# Patient Record
Sex: Male | Born: 1990
Health system: Southern US, Community
[De-identification: ages and names within clinical notes are randomized; demographics above are authoritative.]

## PROBLEM LIST (undated history)

## (undated) DIAGNOSIS — F329 Major depressive disorder, single episode, unspecified: Secondary | ICD-10-CM

## (undated) DIAGNOSIS — F4310 Post-traumatic stress disorder, unspecified: Secondary | ICD-10-CM

## (undated) DIAGNOSIS — F419 Anxiety disorder, unspecified: Secondary | ICD-10-CM

## (undated) DIAGNOSIS — F32A Depression, unspecified: Secondary | ICD-10-CM

## (undated) DIAGNOSIS — I10 Essential (primary) hypertension: Secondary | ICD-10-CM

## (undated) DIAGNOSIS — G43909 Migraine, unspecified, not intractable, without status migrainosus: Secondary | ICD-10-CM

## (undated) HISTORY — DX: Migraine, unspecified, not intractable, without status migrainosus: G43.909

## (undated) HISTORY — PX: OTHER SURGICAL HISTORY: SHX169

---
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
## 2018-08-10 ENCOUNTER — Emergency Department (HOSPITAL_COMMUNITY)
Admission: EM | Admit: 2018-08-10 | Discharge: 2018-08-11 | Disposition: A | Discharge status: Home / Self Care | Payer: Self-pay | Attending: Emergency Medicine | Admitting: Emergency Medicine

## 2018-08-10 ENCOUNTER — Emergency Department (HOSPITAL_COMMUNITY): Payer: Self-pay

## 2018-08-10 ENCOUNTER — Encounter (HOSPITAL_COMMUNITY): Payer: Self-pay | Admitting: Emergency Medicine

## 2018-08-10 ENCOUNTER — Other Ambulatory Visit: Payer: Self-pay

## 2018-08-10 DIAGNOSIS — M546 Pain in thoracic spine: Secondary | ICD-10-CM | POA: Insufficient documentation

## 2018-08-10 DIAGNOSIS — R809 Proteinuria, unspecified: Secondary | ICD-10-CM | POA: Insufficient documentation

## 2018-08-10 NOTE — ED Provider Notes (Signed)
[facility] EMERGENCY DEPARTMENT Provider Note   CSN: [id number] Arrival date & time: 08/10/18  2134     History   Chief Complaint Chief Complaint  Patient presents with  . Back Pain    HPI Marc Sandoval is a 27 y.o. male with no major medical problems presents to the Emergency Department complaining of intermittent worsening central and upper back pain onset 3 weeks ago while "couch surfing" with a friend.  Pain is dull, tight and occasionally sharp and rates it at a 10/10.  He reports the pain worsened 3 days ago.  Pt denies known trauma or falls.  Pt denies heavy lifting or new activities.  Denies fever, chills, headaches, vision changes, neck pain, numbness, tingling, weakness, loss of bowel or bladder control, gait disturbance.  Pt reports taking advil this morning without relief.  Pt denies IVDU, anticoagulation, hx of cancer, known trauma, fevers, weight loss, night sweats.  Pt reports a recent diagnosis of HTN, but is not taking any medication, though it was prescribed by his PCP.  Pt is a current smoker.  The history is provided by the patient and medical records. No language interpreter was used.    History reviewed. No pertinent past medical history.  There are no active problems to display for this patient.   History reviewed. No pertinent surgical history.      Home Medications    Prior to Admission medications   Medication Sig Start Date End Date Taking? Authorizing Provider  meloxicam (MOBIC) 15 MG tablet Take 1 tablet (15 mg total) by mouth daily. 08/11/18   Faydra Korman, Dahlia Client, PA-C  methocarbamol (ROBAXIN) 500 MG tablet Take 1 tablet (500 mg total) by mouth 2 (two) times daily. 08/11/18   Hibo Blasdell, Dahlia Client, PA-C    Family History No family history on file.  Social History Social History   Tobacco Use  . Smoking status: Never Smoker  . Smokeless tobacco: Never Used  Substance Use Topics  . Alcohol use: Yes  . Drug use: Not  Currently     Allergies   Patient has no known allergies.   Review of Systems Review of Systems  Constitutional: Negative for fatigue and fever.  Respiratory: Negative for chest tightness and shortness of breath.   Cardiovascular: Negative for chest pain.  Gastrointestinal: Negative for abdominal pain, diarrhea, nausea and vomiting.  Genitourinary: Negative for dysuria, frequency, hematuria and urgency.  Musculoskeletal: Positive for back pain. Negative for gait problem, joint swelling, neck pain and neck stiffness.  Skin: Negative for rash.  Neurological: Negative for weakness, light-headedness, numbness and headaches.  All other systems reviewed and are negative.    Physical Exam Updated Vital Signs BP (!) 152/93 (BP Location: Right Arm)   Pulse 80   Temp 97.7 F (36.5 C) (Oral)   Resp 18   SpO2 100%   Physical Exam  Constitutional: He appears well-developed and well-nourished. No distress.  HENT:  Head: Normocephalic and atraumatic.  Mouth/Throat: Oropharynx is clear and moist. No oropharyngeal exudate.  Eyes: Conjunctivae are normal.  Neck: Normal range of motion. Neck supple.  Full ROM without pain No midline or paraspinal tenderness  Cardiovascular: Normal rate, regular rhythm and intact distal pulses.  Pulmonary/Chest: Effort normal and breath sounds normal. No respiratory distress. He has no wheezes.  Abdominal: Soft. He exhibits no distension. There is no tenderness.  Musculoskeletal:  Full range of motion of the T-spine and L-spine No midline tenderness to the  T-spine or L-spine Tenderness to palpation of  the paraspinous muscles of the T-spine  Lymphadenopathy:    He has no cervical adenopathy.  Neurological: He is alert.  Speech is clear and goal oriented, follows commands Normal 5/5 strength in upper and lower extremities bilaterally including dorsiflexion and plantar flexion, strong and equal grip strength Sensation normal to light and sharp  touch Moves extremities without ataxia, coordination intact Normal gait Normal balance No Clonus  Skin: Skin is warm and dry. No rash noted. He is not diaphoretic. No erythema.  Psychiatric: He has a normal mood and affect. His behavior is normal.  Nursing note and vitals reviewed.    ED Treatments / Results  Labs (all labs ordered are listed, but only abnormal results are displayed) Labs Reviewed  URINALYSIS, ROUTINE W REFLEX MICROSCOPIC - Abnormal; Notable for the following components:      Result Value   Protein, ur 30 (*)    All other components within normal limits    Radiology Dg Thoracic Spine 2 View  Result Date: 08/10/2018 CLINICAL DATA:  Thoracic spine pain. EXAM: THORACIC SPINE 2 VIEWS COMPARISON:  None. FINDINGS: There is no evidence of thoracic spine fracture. Alignment is normal. No other significant bone abnormalities are identified. IMPRESSION: Negative. Electronically Signed   By: Kennith CenterEric  Mansell M.D.   On: 08/10/2018 23:25    Procedures Procedures (including critical care time)  Medications Ordered in ED Medications  naproxen (NAPROSYN) tablet 500 mg (has no administration in time range)     Initial Impression / Assessment and Plan / ED Course  I have reviewed the triage vital signs and the nursing notes.  Pertinent labs & imaging results that were available during my care of the patient were reviewed by me and considered in my medical decision making (see chart for details).  Clinical Course as of Aug 11 28  Sat Aug 10, 2018  2231 HTN noted  BP(!): 152/93 [HM]    Clinical Course User Index [HM] Josemaria Brining, Dahlia ClientHannah, [place]PA-C    Patient with back pain.  No neurological deficits and normal neuro exam.  Patient can walk without difficulty.  No loss of bowel or bladder control.  No concern for cauda equina.  No fever, night sweats, weight loss, h/o cancer, IVDU.    Plain films of the thoracic spine are without acute abnormality.  I personally evaluated  these images.  Urinalysis without evidence of urinary tract infection.  Some protein is noted.  Additionally, patient is hypertensive.  He has a history of same.  These findings were discussed with the patient and he has been given a resource guide to find a primary care provider.  Patient states he will do this.  Discussed reasons to return immediately to the emergency department.  Patient states understanding and is in agreement with the plan.  Final Clinical Impressions(s) / ED Diagnoses   Final diagnoses:  Acute bilateral thoracic back pain  Proteinuria, unspecified type    ED Discharge Orders         Ordered    meloxicam (MOBIC) 15 MG tablet  Daily     08/11/18 0018    methocarbamol (ROBAXIN) 500 MG tablet  2 times daily     08/11/18 0018           Meredyth Hornung, Boyd KerbsHannah, PA-C 08/11/18 0029    Eber HongMiller, Brian, MD 08/11/18 [phone]

## 2018-08-10 NOTE — ED Triage Notes (Signed)
Pt c/o central back pain x 3 weeks, increasing in last 3 days. Denies injury/trauma. Ambulatory without difficulty.

## 2018-08-10 NOTE — ED Notes (Signed)
Pt in the restroom attempting to provide a urine specimen.

## 2018-08-10 NOTE — ED Notes (Signed)
Patient transported to X-ray 

## 2018-08-11 ENCOUNTER — Emergency Department (HOSPITAL_COMMUNITY): Payer: Self-pay

## 2018-08-11 ENCOUNTER — Emergency Department (HOSPITAL_COMMUNITY)
Admission: EM | Admit: 2018-08-11 | Discharge: 2018-08-11 | Disposition: A | Discharge status: Home / Self Care | Payer: Self-pay | Attending: Emergency Medicine | Admitting: Emergency Medicine

## 2018-08-11 ENCOUNTER — Other Ambulatory Visit: Payer: Self-pay

## 2018-08-11 DIAGNOSIS — M546 Pain in thoracic spine: Secondary | ICD-10-CM | POA: Insufficient documentation

## 2018-08-11 DIAGNOSIS — R0602 Shortness of breath: Secondary | ICD-10-CM | POA: Insufficient documentation

## 2018-08-11 DIAGNOSIS — I10 Essential (primary) hypertension: Secondary | ICD-10-CM | POA: Insufficient documentation

## 2018-08-11 LAB — D-DIMER, QUANTITATIVE: D-Dimer, Quant: <0.27 ug/mL-FEU (ref 0.00–0.50)

## 2018-08-11 LAB — URINALYSIS, ROUTINE W REFLEX MICROSCOPIC
Bacteria, UA: NONE SEEN
Bilirubin Urine: NEGATIVE
Glucose, UA: NEGATIVE mg/dL
Hgb urine dipstick: NEGATIVE
Ketones, ur: NEGATIVE mg/dL
Leukocytes, UA: NEGATIVE
Nitrite: NEGATIVE
PH: 6 (ref 5.0–8.0)
Protein, ur: 30 mg/dL — AB
Specific Gravity, Urine: 1.026 (ref 1.005–1.030)

## 2018-08-11 LAB — CBC WITH DIFFERENTIAL/PLATELET
Abs Immature Granulocytes: 0.02 10*3/uL (ref 0.00–0.07)
Basophils Absolute: 0 10*3/uL (ref 0.0–0.1)
Basophils Relative: 0 %
EOS ABS: 0.2 10*3/uL (ref 0.0–0.5)
Eosinophils Relative: 2 %
HCT: 44 % (ref 39.0–52.0)
Hemoglobin: 14 g/dL (ref 13.0–17.0)
Immature Granulocytes: 0 %
LYMPHS ABS: 2.4 10*3/uL (ref 0.7–4.0)
Lymphocytes Relative: 30 %
MCH: 27.3 pg (ref 26.0–34.0)
MCHC: 31.8 g/dL (ref 30.0–36.0)
MCV: 85.8 fL (ref 80.0–100.0)
Monocytes Absolute: 0.8 10*3/uL (ref 0.1–1.0)
Monocytes Relative: 10 %
Neutro Abs: 4.4 10*3/uL (ref 1.7–7.7)
Neutrophils Relative %: 58 %
Platelets: 250 10*3/uL (ref 150–400)
RBC: 5.13 MIL/uL (ref 4.22–5.81)
RDW: 12.9 % (ref 11.5–15.5)
WBC: 7.8 10*3/uL (ref 4.0–10.5)
nRBC: 0 % (ref 0.0–0.2)

## 2018-08-11 LAB — BASIC METABOLIC PANEL
Anion gap: 5 (ref 5–15)
BUN: 11 mg/dL (ref 6–20)
CO2: 24 mmol/L (ref 22–32)
Calcium: 8.7 mg/dL — ABNORMAL LOW (ref 8.9–10.3)
Chloride: 105 mmol/L (ref 98–111)
Creatinine, Ser: 0.93 mg/dL (ref 0.61–1.24)
GFR calc Af Amer: >60 mL/min (ref >60)
GFR calc non Af Amer: >60 mL/min (ref >60)
Glucose, Bld: 94 mg/dL (ref 70–99)
Potassium: 3.8 mmol/L (ref 3.5–5.1)
Sodium: 134 mmol/L — ABNORMAL LOW (ref 135–145)

## 2018-08-11 MED ORDER — MELOXICAM 15 MG PO TABS: 15.0000 mg | ORAL_TABLET | Freq: Every day | ORAL | 0 refills | Status: DC

## 2018-08-11 MED ORDER — NAPROXEN 250 MG PO TABS
500.0000 mg | ORAL_TABLET | Freq: Once | ORAL | Status: AC
  Administered 2018-08-11: 500 mg via ORAL
  Filled 2018-08-11: qty 2

## 2018-08-11 MED ORDER — METHOCARBAMOL 500 MG PO TABS: 500.0000 mg | ORAL_TABLET | Freq: Two times a day (BID) | ORAL | 0 refills | Status: DC

## 2018-08-11 MED ORDER — KETOROLAC TROMETHAMINE 30 MG/ML IJ SOLN
30.0000 mg | Freq: Once | INTRAMUSCULAR | Status: AC
  Administered 2018-08-11: 30 mg via INTRAVENOUS
  Filled 2018-08-11: qty 1

## 2018-08-11 MED ORDER — KETOROLAC TROMETHAMINE 60 MG/2ML IM SOLN
30.0000 mg | Freq: Once | INTRAMUSCULAR | Status: DC
  Filled 2018-08-11: qty 2

## 2018-08-11 MED ORDER — LIDOCAINE 5 % EX PTCH
1.0000 | MEDICATED_PATCH | CUTANEOUS | Status: DC
  Administered 2018-08-11: 1 via TRANSDERMAL
  Filled 2018-08-11: qty 1

## 2018-08-11 NOTE — ED Notes (Signed)
Patient verbalizes understanding of discharge instructions. Opportunity for questioning and answers were provided. Armband removed by staff, pt discharged from ED ambulatory.   

## 2018-08-11 NOTE — ED Triage Notes (Signed)
Pt brought in by [facility] from home for midthoracic back pain, pt seen last night for same, prescribed a muscle relaxer but has not taken because he believes the pain is related to his "organs" not his muscles. Pt ambulatory, in NAD on arrival.

## 2018-08-11 NOTE — ED Notes (Signed)
Pt. Back from X-Ray

## 2018-08-11 NOTE — Discharge Instructions (Signed)
Please start Mobic and take Robaxin as needed for muscle pain Follow up with a primary doctor

## 2018-08-11 NOTE — ED Notes (Signed)
Pt. Transported to Xray 

## 2018-08-11 NOTE — ED Provider Notes (Signed)
[facility] Provider Note   CSN: [id number] Arrival date & time: 08/11/18  1355    History   Chief Complaint Chief Complaint  Patient presents with  . Back Pain   HPI Hughie Closssaiah Malecha is a 27 y.o. male who presents with thoracic back pain.  Past medical history significant for hypertension.  Patient states that for the past 3 weeks to 1 month has had intermittent thoracic back pain radiating to the left side of his back.  The past 3 days the pain has intensified.  He came to the ED last night was told that he likely musculoskeletal.  He had an x-ray of his thoracic spine and a urine test which were both essentially normal.  He was advised to follow-up with primary care.  This morning the pain became severe and constant in nature. He reports associated SOB and pain with inspiration which is new. He has not taken the meloxicam or Robaxin that was prescribed to him.  He is very concerned that the pain is not musculoskeletal although he does state his sleeping arrangement is not ideal and uncomfortable. He feels like it is a deeper pain and it radiates to his left ribs.  He's never had anything like this before. He denies fever, chest pain, cough, difficulty urinating. No recent surgery/immobilization, hx of cancer, leg swelling, hemoptysis, prior DVT/PE, or hormone use. [place] 11 [place] train ride [place] a [place] weeks ago. He is a smoker.  HPI  No past medical history on file.  There are no active problems to display for this patient.   No past surgical history on file.      Home Medications    Prior to Admission medications   Medication Sig Start Date End Date Taking? Authorizing Provider  meloxicam (MOBIC) 15 MG tablet Take 1 tablet (15 mg total) by mouth daily. 08/11/18   Muthersbaugh, Dahlia ClientHannah, PA-C  methocarbamol (ROBAXIN) 500 MG tablet Take 1 tablet (500 mg total) by mouth 2 (two) times daily. 08/11/18   Muthersbaugh, Dahlia ClientHannah, PA-C    Family  History No family history on file.  Social History Social History   Tobacco Use  . Smoking status: Never Smoker  . Smokeless tobacco: Never Used  Substance Use Topics  . Alcohol use: Yes  . Drug use: Not Currently     Allergies   Patient has no known allergies.   Review of Systems Review of Systems  Constitutional: Negative for fever.  Respiratory: Positive for shortness of breath. Negative for cough.   Cardiovascular: Negative for chest pain.  Gastrointestinal: Negative for abdominal pain.  Musculoskeletal: Positive for back pain. Negative for neck pain.  Neurological: Negative for weakness.  All other systems reviewed and are negative.    Physical Exam Updated Vital Signs BP (!) 148/108 (BP Location: Right Arm)   Pulse (!) 50   Temp 97.9 F (36.6 C) (Oral)   Resp 16   SpO2 100%   Physical Exam  Constitutional: He is oriented to person, place, and time. He appears well-developed and well-nourished. No distress.  Calm and cooperative  HENT:  Head: Normocephalic and atraumatic.  Eyes: Pupils are equal, round, and reactive to light. Conjunctivae are normal. Right eye exhibits no discharge. Left eye exhibits no discharge. No scleral icterus.  Neck: Normal range of motion.  Cardiovascular: Normal rate and regular rhythm.  Pulmonary/Chest: Effort normal and breath sounds normal. No respiratory distress. He exhibits tenderness (left sided lower rib tenderness).  Abdominal:  He exhibits no distension.  Musculoskeletal:  Mid-thoracic spine tenderness with left paraspinal muscle tenderness  Neurological: He is alert and oriented to person, place, and time.  Skin: Skin is warm and dry.  Psychiatric: He has a normal mood and affect. His behavior is normal.  Nursing note and vitals reviewed.    ED Treatments / Results  Labs (all labs ordered are listed, but only abnormal results are displayed) Labs Reviewed  BASIC METABOLIC PANEL - Abnormal; Notable for the following  components:      Result Value   Sodium 134 (*)    Calcium 8.7 (*)    All other components within normal limits  CBC WITH DIFFERENTIAL/PLATELET  D-DIMER, QUANTITATIVE (NOT AT [facility])    EKG EKG Interpretation  Date/Time:  Sunday August 11 2018 14:20:33 EST Ventricular Rate:  65 PR Interval:  128 QRS Duration: 102 QT Interval:  420 QTC Calculation: 436 R Axis:   81 Text Interpretation:  Normal sinus rhythm with sinus arrhythmia Minimal voltage criteria for LVH, may be normal variant Borderline ECG No old tracing to compare Confirmed by Linwood Dibbles [phone]) on 08/11/2018 2:23:11 PM   Radiology Dg Thoracic Spine 2 View  Result Date: 08/10/2018 CLINICAL DATA:  Thoracic spine pain. EXAM: THORACIC SPINE 2 VIEWS COMPARISON:  None. FINDINGS: There is no evidence of thoracic spine fracture. Alignment is normal. No other significant bone abnormalities are identified. IMPRESSION: Negative. Electronically Signed   By: Kennith Center M.D.   On: 08/10/2018 23:25    Procedures Procedures (including critical care time)  Medications Ordered in ED Medications  lidocaine (LIDODERM) 5 % 1 patch (1 patch Transdermal Patch Applied 08/11/18 1428)  ketorolac (TORADOL) 30 MG/ML injection 30 mg (30 mg Intravenous Given 08/11/18 1436)     Initial Impression / Assessment and Plan / ED Course  I have reviewed the triage vital signs and the nursing notes.  Pertinent labs & imaging results that were available during my care of the patient were reviewed by me and considered in my medical decision making (see chart for details).  27 year old male presents with ongoing thoracic back pain which radiates to his ribs.  He is mildly hypertensive but otherwise vital signs are normal.  His pulse is 50 which is likely normal for him since he is young and good shape.  Results were reviewed from yesterday.  He had no signs of infection in his UA.  He did have 30 protein in the urine which is likely due to his hypertension.   His thoracic chest x-ray was normal.  He is very concerned that his pain is not musculoskeletal although is easily reproducible on exam.  Will obtain basic labs, d-dimer, chest x-ray and provide pain control.  CBC and BMP are normal.  D-dimer is normal.  EKG is sinus rhythm with sinus arrhythmia. CXR is negative. Discussed with pt that symptoms are likely MSK. He was advised to f/u with a PCP.  Final Clinical Impressions(s) / ED Diagnoses   Final diagnoses:  Acute left-sided thoracic back pain    ED Discharge Orders    None       Bethel Born, PA-C 08/12/18 1742    Linwood Dibbles, MD 08/13/18 1105

## 2018-08-11 NOTE — Discharge Instructions (Addendum)
1. Medications: meloxicam, methocarbamol, usual home medications 2. Treatment: rest, drink plenty of fluids, gentle stretching as discussed, alternate ice and heat 3. Follow Up: Please followup with your primary doctor in 3 days for discussion of your diagnoses and further evaluation after today's visit; if you do not have a primary care doctor use the resource guide provided to find one;  Return to the ER for worsening back pain, difficulty walking, loss of bowel or bladder control or other concerning symptoms

## 2018-09-16 ENCOUNTER — Inpatient Hospital Stay: Payer: Medicaid Other | Admitting: Critical Care Medicine

## 2018-09-16 ENCOUNTER — Encounter: Payer: Self-pay | Admitting: Family Medicine

## 2018-09-17 ENCOUNTER — Ambulatory Visit: Payer: Medicaid Other | Admitting: Family Medicine

## 2018-10-07 ENCOUNTER — Encounter (HOSPITAL_COMMUNITY): Payer: Self-pay

## 2018-10-07 ENCOUNTER — Other Ambulatory Visit: Payer: Self-pay

## 2018-10-07 ENCOUNTER — Emergency Department (HOSPITAL_COMMUNITY): Payer: No Typology Code available for payment source

## 2018-10-07 ENCOUNTER — Emergency Department (HOSPITAL_COMMUNITY)
Admission: EM | Admit: 2018-10-07 | Discharge: 2018-10-07 | Disposition: A | Discharge status: Home / Self Care | Payer: No Typology Code available for payment source | Attending: Emergency Medicine | Admitting: Emergency Medicine

## 2018-10-07 DIAGNOSIS — Z79899 Other long term (current) drug therapy: Secondary | ICD-10-CM | POA: Insufficient documentation

## 2018-10-07 DIAGNOSIS — S92152A Displaced avulsion fracture (chip fracture) of left talus, initial encounter for closed fracture: Secondary | ICD-10-CM | POA: Diagnosis not present

## 2018-10-07 DIAGNOSIS — Z23 Encounter for immunization: Secondary | ICD-10-CM | POA: Diagnosis not present

## 2018-10-07 DIAGNOSIS — Y999 Unspecified external cause status: Secondary | ICD-10-CM | POA: Diagnosis not present

## 2018-10-07 DIAGNOSIS — M542 Cervicalgia: Secondary | ICD-10-CM | POA: Diagnosis not present

## 2018-10-07 DIAGNOSIS — Y9389 Activity, other specified: Secondary | ICD-10-CM | POA: Diagnosis not present

## 2018-10-07 DIAGNOSIS — S1093XA Contusion of unspecified part of neck, initial encounter: Secondary | ICD-10-CM

## 2018-10-07 DIAGNOSIS — T07XXXA Unspecified multiple injuries, initial encounter: Secondary | ICD-10-CM

## 2018-10-07 DIAGNOSIS — R531 Weakness: Secondary | ICD-10-CM | POA: Insufficient documentation

## 2018-10-07 DIAGNOSIS — M25562 Pain in left knee: Secondary | ICD-10-CM | POA: Diagnosis not present

## 2018-10-07 DIAGNOSIS — I10 Essential (primary) hypertension: Secondary | ICD-10-CM | POA: Insufficient documentation

## 2018-10-07 DIAGNOSIS — S82892A Other fracture of left lower leg, initial encounter for closed fracture: Secondary | ICD-10-CM

## 2018-10-07 DIAGNOSIS — T1490XA Injury, unspecified, initial encounter: Secondary | ICD-10-CM

## 2018-10-07 DIAGNOSIS — M25521 Pain in right elbow: Secondary | ICD-10-CM | POA: Diagnosis not present

## 2018-10-07 DIAGNOSIS — Y929 Unspecified place or not applicable: Secondary | ICD-10-CM | POA: Diagnosis not present

## 2018-10-07 DIAGNOSIS — S99912A Unspecified injury of left ankle, initial encounter: Secondary | ICD-10-CM | POA: Diagnosis present

## 2018-10-07 HISTORY — DX: Essential (primary) hypertension: I10

## 2018-10-07 LAB — CBC
HCT: 44.3 % (ref 39.0–52.0)
HEMOGLOBIN: 14 g/dL (ref 13.0–17.0)
MCH: 27.4 pg (ref 26.0–34.0)
MCHC: 31.6 g/dL (ref 30.0–36.0)
MCV: 86.7 fL (ref 80.0–100.0)
Platelets: 254 10*3/uL (ref 150–400)
RBC: 5.11 MIL/uL (ref 4.22–5.81)
RDW: 13.2 % (ref 11.5–15.5)
WBC: 7.4 10*3/uL (ref 4.0–10.5)
nRBC: 0 % (ref 0.0–0.2)

## 2018-10-07 LAB — PROTIME-INR
INR: 1.18
Prothrombin Time: 14.9 seconds (ref 11.4–15.2)

## 2018-10-07 LAB — LACTIC ACID, PLASMA
Lactic Acid, Venous: 5.6 mmol/L (ref 0.5–1.9)
Lactic Acid, Venous: 1.1 mmol/L (ref 0.5–1.9)

## 2018-10-07 LAB — COMPREHENSIVE METABOLIC PANEL
ALBUMIN: 4.4 g/dL (ref 3.5–5.0)
ALT: 13 U/L (ref 0–44)
ANION GAP: 16 — AB (ref 5–15)
AST: 34 U/L (ref 15–41)
Alkaline Phosphatase: 49 U/L (ref 38–126)
BUN: 17 mg/dL (ref 6–20)
CO2: 17 mmol/L — ABNORMAL LOW (ref 22–32)
Calcium: 9.4 mg/dL (ref 8.9–10.3)
Chloride: 106 mmol/L (ref 98–111)
Creatinine, Ser: 1.42 mg/dL — ABNORMAL HIGH (ref 0.61–1.24)
GFR calc Af Amer: >60 mL/min (ref >60)
GFR calc non Af Amer: >60 mL/min (ref >60)
Glucose, Bld: 158 mg/dL — ABNORMAL HIGH (ref 70–99)
Potassium: 3.5 mmol/L (ref 3.5–5.1)
Sodium: 139 mmol/L (ref 135–145)
Total Bilirubin: 0.9 mg/dL (ref 0.3–1.2)
Total Protein: 7.6 g/dL (ref 6.5–8.1)

## 2018-10-07 LAB — URINALYSIS, ROUTINE W REFLEX MICROSCOPIC
Bacteria, UA: NONE SEEN
Bilirubin Urine: NEGATIVE
Glucose, UA: NEGATIVE mg/dL
Hgb urine dipstick: NEGATIVE
KETONES UR: NEGATIVE mg/dL
Leukocytes, UA: NEGATIVE
Nitrite: NEGATIVE
Protein, ur: 30 mg/dL — AB
Specific Gravity, Urine: 1.035 — ABNORMAL HIGH (ref 1.005–1.030)
pH: 8 (ref 5.0–8.0)

## 2018-10-07 LAB — SAMPLE TO BLOOD BANK

## 2018-10-07 LAB — ETHANOL: Alcohol, Ethyl (B): <10 mg/dL (ref <10)

## 2018-10-07 LAB — CK: Total CK: 338 U/L (ref 49–397)

## 2018-10-07 MED ORDER — IOPAMIDOL (ISOVUE-370) INJECTION 76%
INTRAVENOUS | Status: AC
  Filled 2018-10-07: qty 100

## 2018-10-07 MED ORDER — MORPHINE SULFATE (PF) 4 MG/ML IV SOLN
8.0000 mg | Freq: Once | INTRAVENOUS | Status: AC
  Administered 2018-10-07: 8 mg via INTRAVENOUS
  Filled 2018-10-07: qty 2

## 2018-10-07 MED ORDER — LACTATED RINGERS IV BOLUS
1000.0000 mL | Freq: Once | INTRAVENOUS | Status: AC
  Administered 2018-10-07: 1000 mL via INTRAVENOUS

## 2018-10-07 MED ORDER — TETANUS-DIPHTH-ACELL PERTUSSIS 5-2.5-18.5 LF-MCG/0.5 IM SUSP
0.5000 mL | Freq: Once | INTRAMUSCULAR | Status: AC
  Administered 2018-10-07: 0.5 mL via INTRAMUSCULAR
  Filled 2018-10-07: qty 0.5

## 2018-10-07 MED ORDER — IOPAMIDOL (ISOVUE-370) INJECTION 76%
100.0000 mL | Freq: Once | INTRAVENOUS | Status: AC | PRN
  Administered 2018-10-07: 100 mL via INTRAVENOUS
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
## 2018-10-07 NOTE — ED Notes (Signed)
Ortho notified of crutches and need for left ankle splint.

## 2018-10-07 NOTE — Progress Notes (Signed)
   10/07/18 1700  Clinical Encounter Type  Visited With Health care provider  Visit Type ED  Provided the ministry of silent prayer and presence. No family present.

## 2018-10-07 NOTE — ED Notes (Signed)
Ortho tech at bedside 
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
## 2018-10-07 NOTE — ED Notes (Signed)
Pt verbalized understanding of d/c instructions and has no further questions, VSS, NAD.  

## 2018-10-07 NOTE — Progress Notes (Signed)
Orthopedic Tech Progress Note Patient Details:  Marc Sandoval [birth year] [id number]  Patient ID: Marc Sandoval, male   DOB: 1991/06/26, 28 y.o.   MRN: [id number]   [name] 10/07/2018, 3:43 PMLevel 2 Trauma.

## 2018-10-07 NOTE — Progress Notes (Signed)
Orthopedic Tech Progress Note Patient Details:  Marc Sandoval 12-10-1990 [id number]  Ortho Devices Type of Ortho Device: Crutches, Post (short leg) splint Ortho Device/Splint Location: lle Ortho Device/Splint Interventions: Ordered, Application, Adjustment   Post Interventions Patient Tolerated: Well Instructions Provided: Care of device, Adjustment of device   [name] 10/07/2018, 8:04 PM

## 2018-10-08 ENCOUNTER — Encounter: Payer: Self-pay | Admitting: Family Medicine

## 2018-10-09 ENCOUNTER — Encounter: Payer: Self-pay | Admitting: Family Medicine

## 2018-10-09 ENCOUNTER — Ambulatory Visit (INDEPENDENT_AMBULATORY_CARE_PROVIDER_SITE_OTHER): Payer: Self-pay | Admitting: Family Medicine

## 2018-10-09 VITALS — BP 127/72 | HR 84 | Resp 17 | Ht 75.0 in | Wt 170.0 lb

## 2018-10-09 DIAGNOSIS — S82892A Other fracture of left lower leg, initial encounter for closed fracture: Secondary | ICD-10-CM

## 2018-10-09 DIAGNOSIS — Z7689 Persons encountering health services in other specified circumstances: Secondary | ICD-10-CM

## 2018-10-09 DIAGNOSIS — S4991XA Unspecified injury of right shoulder and upper arm, initial encounter: Secondary | ICD-10-CM

## 2018-10-09 DIAGNOSIS — Z113 Encounter for screening for infections with a predominantly sexual mode of transmission: Secondary | ICD-10-CM

## 2018-10-09 DIAGNOSIS — M546 Pain in thoracic spine: Secondary | ICD-10-CM

## 2018-10-09 DIAGNOSIS — S63501A Unspecified sprain of right wrist, initial encounter: Secondary | ICD-10-CM

## 2018-10-09 DIAGNOSIS — R519 Headache, unspecified: Secondary | ICD-10-CM

## 2018-10-09 DIAGNOSIS — Z202 Contact with and (suspected) exposure to infections with a predominantly sexual mode of transmission: Secondary | ICD-10-CM

## 2018-10-09 DIAGNOSIS — J0100 Acute maxillary sinusitis, unspecified: Secondary | ICD-10-CM

## 2018-10-09 DIAGNOSIS — Z711 Person with feared health complaint in whom no diagnosis is made: Secondary | ICD-10-CM

## 2018-10-09 DIAGNOSIS — R51 Headache: Secondary | ICD-10-CM

## 2018-10-09 MED ORDER — MELOXICAM 15 MG PO TABS: 15.0000 mg | ORAL_TABLET | Freq: Every day | ORAL | 0 refills | Status: DC

## 2018-10-09 MED ORDER — CEPHALEXIN 500 MG PO CAPS: 500.0000 mg | ORAL_CAPSULE | Freq: Three times a day (TID) | ORAL | 0 refills | Status: DC

## 2018-10-09 MED ORDER — AMOXICILLIN-POT CLAVULANATE 875-125 MG PO TABS: 1.0000 | ORAL_TABLET | Freq: Two times a day (BID) | ORAL | 0 refills | Status: DC

## 2018-10-09 MED ORDER — CYCLOBENZAPRINE HCL 10 MG PO TABS: 10.0000 mg | ORAL_TABLET | Freq: Three times a day (TID) | ORAL | 0 refills | Status: DC | PRN
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
## 2018-10-10 ENCOUNTER — Telehealth: Payer: Self-pay | Admitting: Family Medicine

## 2018-10-10 ENCOUNTER — Encounter (HOSPITAL_COMMUNITY): Payer: Self-pay | Admitting: Emergency Medicine

## 2018-10-10 ENCOUNTER — Emergency Department (HOSPITAL_COMMUNITY)
Admission: EM | Admit: 2018-10-10 | Discharge: 2018-10-10 | Disposition: A | Discharge status: Home / Self Care | Payer: No Typology Code available for payment source | Attending: Emergency Medicine | Admitting: Emergency Medicine

## 2018-10-10 DIAGNOSIS — S92902D Unspecified fracture of left foot, subsequent encounter for fracture with routine healing: Secondary | ICD-10-CM | POA: Diagnosis not present

## 2018-10-10 DIAGNOSIS — M25531 Pain in right wrist: Secondary | ICD-10-CM | POA: Insufficient documentation

## 2018-10-10 DIAGNOSIS — Z79899 Other long term (current) drug therapy: Secondary | ICD-10-CM | POA: Insufficient documentation

## 2018-10-10 DIAGNOSIS — I10 Essential (primary) hypertension: Secondary | ICD-10-CM | POA: Insufficient documentation

## 2018-10-10 LAB — CBC WITH DIFFERENTIAL/PLATELET
Basophils Absolute: 0 10*3/uL (ref 0.0–0.2)
Basos: 0 %
EOS (ABSOLUTE): 0.1 10*3/uL (ref 0.0–0.4)
EOS: 2 %
HEMATOCRIT: 38.4 % (ref 37.5–51.0)
Hemoglobin: 12.7 g/dL — ABNORMAL LOW (ref 13.0–17.7)
Immature Grans (Abs): 0 10*3/uL (ref 0.0–0.1)
Immature Granulocytes: 0 %
LYMPHS ABS: 1.9 10*3/uL (ref 0.7–3.1)
Lymphs: 29 %
MCH: 28.6 pg (ref 26.6–33.0)
MCHC: 33.1 g/dL (ref 31.5–35.7)
MCV: 87 fL (ref 79–97)
MONOCYTES: 11 %
Monocytes Absolute: 0.7 10*3/uL (ref 0.1–0.9)
Neutrophils Absolute: 3.9 10*3/uL (ref 1.4–7.0)
Neutrophils: 58 %
Platelets: 255 10*3/uL (ref 150–450)
RBC: 4.44 x10E6/uL (ref 4.14–5.80)
RDW: 13.2 % (ref 11.6–15.4)
WBC: 6.6 10*3/uL (ref 3.4–10.8)

## 2018-10-10 LAB — BASIC METABOLIC PANEL
BUN/Creatinine Ratio: 14 (ref 9–20)
BUN: 13 mg/dL (ref 6–20)
CO2: 23 mmol/L (ref 20–29)
CREATININE: 0.93 mg/dL (ref 0.76–1.27)
Calcium: 9.4 mg/dL (ref 8.7–10.2)
Chloride: 103 mmol/L (ref 96–106)
GFR calc Af Amer: 130 mL/min/{1.73_m2} (ref >59)
GFR calc non Af Amer: 112 mL/min/{1.73_m2} (ref >59)
Glucose: 91 mg/dL (ref 65–99)
Potassium: 4.2 mmol/L (ref 3.5–5.2)
Sodium: 139 mmol/L (ref 134–144)

## 2018-10-10 LAB — HIV ANTIBODY (ROUTINE TESTING W REFLEX): HIV Screen 4th Generation wRfx: NONREACTIVE

## 2018-10-10 LAB — RPR: RPR Ser Ql: NONREACTIVE

## 2018-10-10 MED ORDER — HYDROCODONE-ACETAMINOPHEN 5-325 MG PO TABS
1.0000 | ORAL_TABLET | Freq: Once | ORAL | Status: AC
  Administered 2018-10-10: 1 via ORAL
  Filled 2018-10-10: qty 1

## 2018-10-10 MED ORDER — IBUPROFEN 600 MG PO TABS: 600.0000 mg | ORAL_TABLET | Freq: Four times a day (QID) | ORAL | 0 refills | Status: AC | PRN

## 2018-10-10 MED ORDER — KETOROLAC TROMETHAMINE 30 MG/ML IJ SOLN
30.0000 mg | Freq: Once | INTRAMUSCULAR | Status: AC
  Administered 2018-10-10: 30 mg via INTRAMUSCULAR
  Filled 2018-10-10: qty 1
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
## 2018-10-10 NOTE — Discharge Planning (Signed)
[facility] consulted regarding ortho co-pay.  EDCM met with pt at bedside and pt states he has [facility] discount but can not afford co-pay.  EDCM suggested the [facility] orange card program; pt states he has that application but has not completed it yet.  EDCM advised to complete ASAP as that program can assist with referral co-pays.  Pt verbalized understanding.  No further EDCM needs identified at this time.

## 2018-10-10 NOTE — Progress Notes (Signed)
Orthopedic Tech Progress Note Patient Details:  Marc Sandoval 1991/03/08 [id number]  Ortho Devices Type of Ortho Device: Long leg splint, Crutches Ortho Device/Splint Interventions: Ordered, Application, Adjustment   Post Interventions Patient Tolerated: Well Instructions Provided: Adjustment of device, Care of device   Nickolas Chalfin J Sagrario Lineberry 10/10/2018, 2:55 PM
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
## 2018-10-10 NOTE — Telephone Encounter (Signed)
Thanks for the update. He was advised to schedule appointment while applying for assistance.

## 2018-10-10 NOTE — Progress Notes (Signed)
CSW acknowledges consult for Medication needs. CSW has updated RN CM of this. RNCM following for further needs at this time.      [name] Zelta Enfield, MSW, LCSW-A Emergency Department Clinical Social Worker [phone]

## 2018-10-10 NOTE — Telephone Encounter (Signed)
Note from [facility]: Spoke with patient who stated he does not have insurance, but is in the process of applying for [facility] 100% coverage.  Patient was told that he would be considered self-pay until we received the 100% coverage letter.  Patient did not want to schedule the appointment.  We will close referral per patient request.  Spoke with [facility] and they told me that they could have billed him for that $100 but patient refused.

## 2018-10-11 ENCOUNTER — Telehealth: Payer: Self-pay | Admitting: Family Medicine

## 2018-10-11 ENCOUNTER — Encounter (HOSPITAL_COMMUNITY): Payer: Self-pay

## 2018-10-11 ENCOUNTER — Emergency Department (HOSPITAL_COMMUNITY)
Admission: EM | Admit: 2018-10-11 | Discharge: 2018-10-11 | Disposition: A | Discharge status: Home / Self Care | Payer: No Typology Code available for payment source | Attending: Emergency Medicine | Admitting: Emergency Medicine

## 2018-10-11 ENCOUNTER — Other Ambulatory Visit: Payer: Self-pay

## 2018-10-11 DIAGNOSIS — M79605 Pain in left leg: Secondary | ICD-10-CM | POA: Diagnosis present

## 2018-10-11 DIAGNOSIS — S82892D Other fracture of left lower leg, subsequent encounter for closed fracture with routine healing: Secondary | ICD-10-CM | POA: Diagnosis not present

## 2018-10-11 DIAGNOSIS — Z79899 Other long term (current) drug therapy: Secondary | ICD-10-CM | POA: Diagnosis not present

## 2018-10-11 DIAGNOSIS — S82892A Other fracture of left lower leg, initial encounter for closed fracture: Secondary | ICD-10-CM

## 2018-10-11 NOTE — ED Notes (Addendum)
Patient refused to sign DC pad, DC instructions reviewed with patient. Patient stated "I do not feel like I was properly cared for here and I will not sign that." CAM walker applied by ortho, patient was given a card with a number to call to follow up with patient experience.
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
## 2018-10-11 NOTE — Telephone Encounter (Signed)
Per [facility] Orth[facility]   10/10/2018  Spoke with patient who stated he does not have insurance, but is in the process of applying for [facility] 100% coverage.  Patient was told that he would be considered self-pay until we received the 100% coverage letter.  Patient did not want to schedule the appointment.  We will close referral per patient request.

## 2018-10-11 NOTE — Discharge Instructions (Addendum)
Follow-up with orthopedic surgery and the primary care doctor.

## 2018-10-11 NOTE — Telephone Encounter (Signed)
Patient would like referral to an orthopedic specialist to be reopened, patient stated that he was unaware that he could be seen without having the copay.

## 2018-10-13 DIAGNOSIS — S82892A Other fracture of left lower leg, initial encounter for closed fracture: Secondary | ICD-10-CM | POA: Insufficient documentation

## 2018-10-14 LAB — GC/CHLAMYDIA PROBE AMP
CHLAMYDIA, DNA PROBE: NEGATIVE
Neisseria gonorrhoeae by PCR: NEGATIVE

## 2018-10-21 ENCOUNTER — Ambulatory Visit: Payer: Medicaid Other | Admitting: Internal Medicine

## 2018-10-25 ENCOUNTER — Encounter: Payer: Self-pay | Admitting: Family Medicine

## 2018-12-10 ENCOUNTER — Telehealth: Payer: Medicaid Other | Admitting: Physician Assistant

## 2018-12-10 ENCOUNTER — Encounter: Payer: Self-pay | Admitting: Family Medicine

## 2018-12-10 DIAGNOSIS — J301 Allergic rhinitis due to pollen: Secondary | ICD-10-CM

## 2018-12-10 MED ORDER — FLUTICASONE PROPIONATE 50 MCG/ACT NA SUSP: 2.0000 | Freq: Every day | NASAL | 6 refills | Status: DC

## 2018-12-10 MED ORDER — OLOPATADINE HCL 0.2 % OP SOLN: 1.0000 [drp] | Freq: Every day | OPHTHALMIC | 0 refills | Status: DC

## 2018-12-10 MED ORDER — LEVOCETIRIZINE DIHYDROCHLORIDE 5 MG PO TABS: 5.0000 mg | ORAL_TABLET | Freq: Every evening | ORAL | 0 refills | Status: DC

## 2018-12-10 NOTE — Addendum Note (Signed)
Addended by: Ofilia Neas on: 12/10/2018 03:07 PM   Modules accepted: Orders

## 2018-12-10 NOTE — Progress Notes (Signed)
E visit for Allergic Rhinitis We are sorry that you are not feeling well.  Here is how we plan to help!  Based on what you have shared with me it looks like you have Allergic Rhinitis.  Rhinitis is when a reaction occurs that causes nasal congestion, runny nose, sneezing, and itching.  Most types of rhinitis are caused by an inflammation and are associated with symptoms in the eyes ears or throat. There are several types of rhinitis.  The most common are acute rhinitis, which is usually caused by a viral illness, allergic or seasonal rhinitis, and nonallergic or year-round rhinitis.  Nasal allergies occur certain times of the year.  Allergic rhinitis is caused when allergens in the air trigger the release of histamine in the body.  Histamine causes itching, swelling, and fluid to build up in the fragile linings of the nasal passages, sinuses and eyelids.  An itchy nose and clear discharge are common.  I recommend the following over the counter treatments: Xyzal 5 mg take 1 tablet daily  I also would recommend a nasal spray: Flonase 2 sprays into each nostril once daily, of which I have prescribed.   You may also benefit from eye drops such as:  Pataday OTC  HOME CARE:   You can use an over-the-counter saline nasal spray as needed  Avoid areas where there is heavy dust, mites, or molds  Stay indoors on windy days during the pollen season  Keep windows closed in home, at least in bedroom; use air conditioner.  Use high-efficiency house air filter  Keep windows closed in car, turn AC on re-circulate  Avoid playing out with dog during pollen season  GET HELP RIGHT AWAY IF:   If your symptoms do not improve within 10 days  You become short of breath  You develop yellow or green discharge from your nose for over 3 days  You have coughing fits  MAKE SURE YOU:   Understand these instructions  Will watch your condition  Will get help right away if you are not doing well or  get worse  Thank you for choosing an e-visit. Your e-visit answers were reviewed by a board certified advanced clinical practitioner to complete your personal care plan. Depending upon the condition, your plan could have included both over the counter or prescription medications. Please review your pharmacy choice. Be sure that the pharmacy you have chosen is open so that you can pick up your prescription now.  If there is a problem you may message your provider in MyChart to have the prescription routed to another pharmacy. Your safety is important to [place]. If you have drug allergies check your prescription carefully.  For the next 24 hours, you can use MyChart to ask questions about today's visit, request a non-urgent call back, or ask for a work or school excuse from your e-visit provider. You will get an email in the next two days asking about your experience. I hope that your e-visit has been valuable and will speed your recovery.

## 2019-01-11 ENCOUNTER — Encounter: Payer: Self-pay | Admitting: Family Medicine

## 2019-01-21 ENCOUNTER — Telehealth: Payer: Self-pay

## 2019-01-21 ENCOUNTER — Ambulatory Visit: Payer: Medicaid Other | Admitting: Family Medicine

## 2019-01-21 NOTE — Telephone Encounter (Signed)
Called patient to do their pre-visit COVID screening.  Have you recently traveled internationally([place], [place], [place], [place], [place]) or within the [place] to a hotspot area([place], [place], [place], [place], [place])? no  Are you currently experiencing any of the following: fever, cough, SHOB, fatigue? no  Have you been in contact with anyone who has recently travelled? no  Have you been in contact with anyone who is experiencing fever, cough, SHOB, fatigue or been diagnosed with COVID  or works in or has recently visited a SNF? no  

## 2019-01-23 ENCOUNTER — Ambulatory Visit: Payer: Medicaid Other | Admitting: Family Medicine

## 2019-01-30 ENCOUNTER — Ambulatory Visit (INDEPENDENT_AMBULATORY_CARE_PROVIDER_SITE_OTHER): Payer: Self-pay | Admitting: Family Medicine

## 2019-01-30 ENCOUNTER — Encounter: Payer: Self-pay | Admitting: Family Medicine

## 2019-01-30 ENCOUNTER — Other Ambulatory Visit: Payer: Self-pay

## 2019-01-30 DIAGNOSIS — F411 Generalized anxiety disorder: Secondary | ICD-10-CM

## 2019-01-30 DIAGNOSIS — F410 Panic disorder [episodic paroxysmal anxiety] without agoraphobia: Secondary | ICD-10-CM

## 2019-01-30 DIAGNOSIS — F4310 Post-traumatic stress disorder, unspecified: Secondary | ICD-10-CM

## 2019-01-30 DIAGNOSIS — F330 Major depressive disorder, recurrent, mild: Secondary | ICD-10-CM

## 2019-01-30 DIAGNOSIS — R002 Palpitations: Secondary | ICD-10-CM

## 2019-01-30 MED ORDER — HYDROXYZINE HCL 50 MG PO TABS: 50.0000 mg | ORAL_TABLET | Freq: Three times a day (TID) | ORAL | 2 refills | Status: DC | PRN
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
## 2019-01-30 NOTE — Progress Notes (Deleted)
Called patient to initiate their telephone visit with provider Joaquin Courts, FNP-C. Verified date of birth. Patient states that he has been having panic attacks & daily headaches. KWalker, CMA.

## 2019-01-30 NOTE — Progress Notes (Deleted)
Patient ID: Marc Sandoval, male    DOB: 24-Sep-1990, 28 y.o.   MRN: [id number]  PCP: Bing NeighborsHarris, Kioni Stahl S, FNP  No chief complaint on file.   Subjective:  HPI  Marc Sandoval is a 28 y.o. male presents for evaluation   has Closed fracture of left ankle on their problem list.   Today's visit:    GAD 7 : Generalized Anxiety Score 01/30/2019 10/09/2018  Nervous, Anxious, on Edge 2 3  Control/stop worrying 2 2  Worry too much - different things 2 2  Trouble relaxing 2 1  Restless 1 0  Easily annoyed or irritable 2 1  Afraid - awful might happen 0 0  Total GAD 7 Score 11 9      Social History   Socioeconomic History  . Marital status: Single    Spouse name: Not on file  . Number of children: Not on file  . Years of education: Not on file  . Highest education level: Not on file  Occupational History  . Not on file  Social Needs  . Financial resource strain: Not on file  . Food insecurity:    Worry: Not on file    Inability: Not on file  . Transportation needs:    Medical: Not on file    Non-medical: Not on file  Tobacco Use  . Smoking status: Never Smoker  . Smokeless tobacco: Never Used  Substance and Sexual Activity  . Alcohol use: Yes    Comment: occ  . Drug use: Yes    Types: Marijuana  . Sexual activity: Not on file  Lifestyle  . Physical activity:    Days per week: Not on file    Minutes per session: Not on file  . Stress: Not on file  Relationships  . Social connections:    Talks on phone: Not on file    Gets together: Not on file    Attends religious service: Not on file    Active member of club or organization: Not on file    Attends meetings of clubs or organizations: Not on file    Relationship status: Not on file  . Intimate partner violence:    Fear of current or ex partner: Not on file    Emotionally abused: Not on file    Physically abused: Not on file    Forced sexual activity: Not on file  Other Topics Concern  . Not on file  Social  History Narrative   ** Merged History Encounter **        Family History  Problem Relation Age of Onset  . Healthy Mother   . Healthy Daughter   . Stroke Neg Hx      Review of Systems  Allergies  Allergen Reactions  . Dayquil [Pseudoephedrine-Apap-Dm] Hives  . Doxylamine-Phenylephrine-Apap Hives  . Doxylamine-Phenylephrine-Apap Hives  . Nyquil Hbp Cold & Flu [Dm-Doxylamine-Acetaminophen] Hives    Prior to Admission medications   Not on File    Past Medical, Surgical Family and Social History reviewed and updated.    Objective:  There were no vitals filed for this visit.  BP Readings from Last 3 Encounters:  10/11/18 (!) 147/95  10/10/18 (!) 106/94  10/09/18 127/72    There were no vitals filed for this visit.     Physical Exam General appearance: alert, well developed, well nourished, cooperative and in no distress Head: Normocephalic, without obvious abnormality, atraumatic Respiratory: Respirations even and unlabored, normal respiratory rate Heart: rate and rhythm normal. No  gallop or murmurs noted on exam  Extremities: No gross deformities Skin: Skin color, texture, turgor normal. No rashes seen  Psych: Appropriate mood and affect. Neurologic: Mental status: Alert, oriented to person, place, and time, thought content appropriate.  No results found for: POCGLU  No results found for: HGBA1C          Assessment & Plan:  There are no diagnoses linked to this encounter.      Joaquin Courts, FNP Primary Care at [facility] [address] [place] [address] [phone]: [phone]

## 2019-02-07 ENCOUNTER — Telehealth: Payer: Self-pay

## 2019-02-07 NOTE — Telephone Encounter (Signed)

## 2019-02-10 ENCOUNTER — Encounter: Payer: Self-pay | Admitting: Family Medicine

## 2019-02-10 ENCOUNTER — Ambulatory Visit: Payer: Medicaid Other | Admitting: Family Medicine

## 2019-02-11 ENCOUNTER — Ambulatory Visit: Payer: Medicaid Other | Admitting: Family Medicine

## 2019-02-13 ENCOUNTER — Ambulatory Visit: Payer: Medicaid Other | Admitting: Family Medicine

## 2019-03-17 ENCOUNTER — Encounter: Payer: Self-pay | Admitting: Family Medicine

## 2019-05-21 ENCOUNTER — Telehealth: Payer: Self-pay

## 2019-05-21 NOTE — Telephone Encounter (Signed)
Called patient to do their pre-visit COVID screening.  Call went to voicemail. Unable to do prescreening.  

## 2019-05-22 ENCOUNTER — Ambulatory Visit (INDEPENDENT_AMBULATORY_CARE_PROVIDER_SITE_OTHER): Payer: Medicaid Other | Admitting: Family Medicine

## 2019-05-22 ENCOUNTER — Other Ambulatory Visit: Payer: Self-pay

## 2019-05-22 VITALS — BP 126/76 | HR 63 | Temp 97.5°F | Resp 17 | Ht 75.0 in | Wt 162.0 lb

## 2019-05-22 DIAGNOSIS — Z0000 Encounter for general adult medical examination without abnormal findings: Secondary | ICD-10-CM | POA: Diagnosis not present

## 2019-05-22 DIAGNOSIS — F4310 Post-traumatic stress disorder, unspecified: Secondary | ICD-10-CM

## 2019-05-22 DIAGNOSIS — Z13228 Encounter for screening for other metabolic disorders: Secondary | ICD-10-CM

## 2019-05-22 DIAGNOSIS — F1210 Cannabis abuse, uncomplicated: Secondary | ICD-10-CM

## 2019-05-22 DIAGNOSIS — F5089 Other specified eating disorder: Secondary | ICD-10-CM

## 2019-05-22 DIAGNOSIS — F330 Major depressive disorder, recurrent, mild: Secondary | ICD-10-CM

## 2019-05-22 MED ORDER — PAROXETINE HCL 20 MG PO TABS: 20.0000 mg | ORAL_TABLET | Freq: Every day | ORAL | 2 refills | Status: DC
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
## 2019-05-22 NOTE — Progress Notes (Signed)
Patient is not fasting.  Declines flu shot.  Is interested in a referral for either outpatient rehab or counseling to help stop smoking weed.   Is also concerned about weight. Was told that he had an eating disorder in the past & he's noticing similar symptoms.

## 2019-05-22 NOTE — Patient Instructions (Signed)
Preventive Care 19-28 Years Old, Male Preventive care refers to lifestyle choices and visits with your health care provider that can promote health and wellness. This includes:  A yearly physical exam. This is also called an annual well check.  Regular dental and eye exams.  Immunizations.  Screening for certain conditions.  Healthy lifestyle choices, such as eating a healthy diet, getting regular exercise, not using drugs or products that contain nicotine and tobacco, and limiting alcohol use. What can I expect for my preventive care visit? Physical exam Your health care provider will check:  Height and weight. These may be used to calculate body mass index (BMI), which is a measurement that tells if you are at a healthy weight.  Heart rate and blood pressure.  Your skin for abnormal spots. Counseling Your health care provider may ask you questions about:  Alcohol, tobacco, and drug use.  Emotional well-being.  Home and relationship well-being.  Sexual activity.  Eating habits.  Work and work Statistician. What immunizations do I need?  Influenza (flu) vaccine  This is recommended every year. Tetanus, diphtheria, and pertussis (Tdap) vaccine  You may need a Td booster every 10 years. Varicella (chickenpox) vaccine  You may need this vaccine if you have not already been vaccinated. Human papillomavirus (HPV) vaccine  If recommended by your health care provider, you may need three doses over 6 months. Measles, mumps, and rubella (MMR) vaccine  You may need at least one dose of MMR. You may also need a second dose. Meningococcal conjugate (MenACWY) vaccine  One dose is recommended if you are 45-76 years old and a Market researcher living in a residence hall, or if you have one of several medical conditions. You may also need additional booster doses. Pneumococcal conjugate (PCV13) vaccine  You may need this if you have certain conditions and were not  previously vaccinated. Pneumococcal polysaccharide (PPSV23) vaccine  You may need one or two doses if you smoke cigarettes or if you have certain conditions. Hepatitis A vaccine  You may need this if you have certain conditions or if you travel or work in places where you may be exposed to hepatitis A. Hepatitis B vaccine  You may need this if you have certain conditions or if you travel or work in places where you may be exposed to hepatitis B. Haemophilus influenzae type b (Hib) vaccine  You may need this if you have certain risk factors. You may receive vaccines as individual doses or as more than one vaccine together in one shot (combination vaccines). Talk with your health care provider about the risks and benefits of combination vaccines. What tests do I need? Blood tests  Lipid and cholesterol levels. These may be checked every 5 years starting at age 17.  Hepatitis C test.  Hepatitis B test. Screening   Diabetes screening. This is done by checking your blood sugar (glucose) after you have not eaten for a while (fasting).  Sexually transmitted disease (STD) testing. Talk with your health care provider about your test results, treatment options, and if necessary, the need for more tests. Follow these instructions at home: Eating and drinking   Eat a diet that includes fresh fruits and vegetables, whole grains, lean protein, and low-fat dairy products.  Take vitamin and mineral supplements as recommended by your health care provider.  Do not drink alcohol if your health care provider tells you not to drink.  If you drink alcohol: ? Limit how much you have to 0-2  drinks a day. ? Be aware of how much alcohol is in your drink. In the [place] one drink equals one 12 oz bottle of beer (355 mL), one 5 oz glass of wine (148 mL), or one 1 oz glass of hard liquor (44 mL). Lifestyle  Take daily care of your teeth and gums.  Stay active. Exercise for at least 30 minutes on 5 or  more days each week.  Do not use any products that contain nicotine or tobacco, such as cigarettes, e-cigarettes, and chewing tobacco. If you need help quitting, ask your health care provider.  If you are sexually active, practice safe sex. Use a condom or other form of protection to prevent STIs (sexually transmitted infections). What's next?  Go to your health care provider once a year for a well check visit.  Ask your health care provider how often you should have your eyes and teeth checked.  Stay up to date on all vaccines. This information is not intended to replace advice given to you by your health care provider. Make sure you discuss any questions you have with your health care provider. Document Released: 10/17/2001 Document Revised: 08/15/2018 Document Reviewed: 08/15/2018 Elsevier Patient Education  2020 Elsevier Inc.  

## 2019-05-23 LAB — CMP14+EGFR
ALT: 8 IU/L (ref 0–44)
AST: 20 IU/L (ref 0–40)
Albumin/Globulin Ratio: 1.6 (ref 1.2–2.2)
Albumin: 4.7 g/dL (ref 4.1–5.2)
Alkaline Phosphatase: 54 IU/L (ref 39–117)
BUN/Creatinine Ratio: 15 (ref 9–20)
BUN: 15 mg/dL (ref 6–20)
Bilirubin Total: 0.4 mg/dL (ref 0.0–1.2)
CO2: 26 mmol/L (ref 20–29)
Calcium: 9.7 mg/dL (ref 8.7–10.2)
Chloride: 100 mmol/L (ref 96–106)
Creatinine, Ser: 1.03 mg/dL (ref 0.76–1.27)
GFR calc Af Amer: 115 mL/min/{1.73_m2} (ref >59)
GFR calc non Af Amer: 99 mL/min/{1.73_m2} (ref >59)
Globulin, Total: 3 g/dL (ref 1.5–4.5)
Glucose: 73 mg/dL (ref 65–99)
Potassium: 4.2 mmol/L (ref 3.5–5.2)
Sodium: 141 mmol/L (ref 134–144)
Total Protein: 7.7 g/dL (ref 6.0–8.5)

## 2019-05-23 LAB — CBC WITH DIFFERENTIAL/PLATELET
Basophils Absolute: 0 10*3/uL (ref 0.0–0.2)
Basos: 0 %
EOS (ABSOLUTE): 0.2 10*3/uL (ref 0.0–0.4)
Eos: 4 %
Hematocrit: 42.1 % (ref 37.5–51.0)
Hemoglobin: 14.1 g/dL (ref 13.0–17.7)
Immature Grans (Abs): 0 10*3/uL (ref 0.0–0.1)
Immature Granulocytes: 0 %
Lymphocytes Absolute: 2.2 10*3/uL (ref 0.7–3.1)
Lymphs: 34 %
MCH: 28.3 pg (ref 26.6–33.0)
MCHC: 33.5 g/dL (ref 31.5–35.7)
MCV: 84 fL (ref 79–97)
Monocytes Absolute: 0.6 10*3/uL (ref 0.1–0.9)
Monocytes: 9 %
Neutrophils Absolute: 3.5 10*3/uL (ref 1.4–7.0)
Neutrophils: 53 %
Platelets: 214 10*3/uL (ref 150–450)
RBC: 4.99 x10E6/uL (ref 4.14–5.80)
RDW: 12.7 % (ref 11.6–15.4)
WBC: 6.5 10*3/uL (ref 3.4–10.8)

## 2019-05-23 LAB — T4, FREE: Free T4: 1.31 ng/dL (ref 0.82–1.77)

## 2019-05-23 LAB — TSH: TSH: 0.642 u[IU]/mL (ref 0.450–4.500)

## 2019-06-02 ENCOUNTER — Telehealth: Payer: Self-pay | Admitting: Family Medicine

## 2019-06-02 ENCOUNTER — Telehealth: Payer: Self-pay | Admitting: Licensed Clinical Social Worker

## 2019-06-02 ENCOUNTER — Encounter: Payer: Self-pay | Admitting: Family Medicine

## 2019-06-02 NOTE — Telephone Encounter (Signed)
Hi Jasmine, I sent you a referral for this patient: can you please see his message below and reach out to him? Thanks. "i am still awaiting the referrals or the contact from the social worker regarding my mental health. no one contacted me yet, nor have i received a number to reach out to them. "

## 2019-06-02 NOTE — Telephone Encounter (Signed)
I have sent him a my chart message.  Thank you

## 2019-06-02 NOTE — Telephone Encounter (Signed)
Call placed to patient to follow up on [facility] referral. LCSW was informed she had the wrong number and call was disconnected.

## 2019-06-02 NOTE — Telephone Encounter (Signed)
Call placed to patient to follow up on [facility] referral. LCSW was informed she had the wrong number and call was disconnected. Could you encourage pt to contact [place] or LCSW's direct extension at [phone] to schedule appointment.

## 2019-06-04 ENCOUNTER — Telehealth: Payer: Self-pay | Admitting: Family Medicine

## 2019-06-04 NOTE — Telephone Encounter (Signed)
Please contact patient as he was told you'd get in touch with him.

## 2019-06-04 NOTE — Telephone Encounter (Signed)
Please inform me when you have contact pt and scheduled appointment. Thanks

## 2019-06-05 NOTE — Telephone Encounter (Signed)
Sent him a message on [place], he returned my call! I verified the phone number, it was his old number, so it is now updated! He has an appointment with you on 10/13 at 2pm! He was just as confused as to why we hadn't called him as we were!

## 2019-06-13 ENCOUNTER — Encounter: Payer: Self-pay | Admitting: Family Medicine

## 2019-06-14 ENCOUNTER — Ambulatory Visit
Admission: EM | Admit: 2019-06-14 | Discharge: 2019-06-14 | Disposition: A | Discharge status: Home / Self Care | Payer: Medicaid Other

## 2019-06-14 ENCOUNTER — Other Ambulatory Visit: Payer: Self-pay

## 2019-06-14 DIAGNOSIS — L309 Dermatitis, unspecified: Secondary | ICD-10-CM

## 2019-06-14 HISTORY — DX: Anxiety disorder, unspecified: F41.9

## 2019-06-14 NOTE — ED Provider Notes (Signed)
[name]    CSN: [id number] Arrival date & time: 06/14/19  0940      History   Chief Complaint No chief complaint on file.   HPI Raylyn Speckman is a 28 y.o. male with history of hypertension presenting for skin lesion on left side of chest.  Patient states he went swimming 3 days ago, noticed puffy, swelling, redness over his upper left chest.  Patient denies known bug bite, trauma to the area.  Never had any discharge, fever, malaise.  States lesion is now resolving, he "just want to make sure ".   Past Medical History:  Diagnosis Date  . Anxiety   . Hypertension   . Migraines     Patient Active Problem List   Diagnosis Date Noted  . Closed fracture of left ankle 10/13/2018    History reviewed. No pertinent surgical history.     Home Medications    Prior to Admission medications   Medication Sig Start Date End Date Taking? Authorizing Provider  PARoxetine (PAXIL) 20 MG tablet Take 1 tablet (20 mg total) by mouth daily. 05/22/19   Charlott Rakes, MD    Family History Family History  Problem Relation Age of Onset  . Healthy Mother   . Healthy Daughter   . Stroke Neg Hx     Social History Social History   Tobacco Use  . Smoking status: Never Smoker  . Smokeless tobacco: Never Used  Substance Use Topics  . Alcohol use: Yes    Comment: occ  . Drug use: Yes    Types: Marijuana     Allergies   Dayquil [pseudoephedrine-apap-dm], Doxylamine-phenylephrine-apap, Doxylamine-phenylephrine-apap, and Nyquil hbp cold & flu [dm-doxylamine-acetaminophen]   Review of Systems Review of Systems  Constitutional: Negative for fatigue and fever.  Respiratory: Negative for cough and shortness of breath.   Cardiovascular: Negative for chest pain and palpitations.  Gastrointestinal: Negative for abdominal pain, diarrhea and vomiting.  Musculoskeletal: Negative for arthralgias and myalgias.  Skin: Positive for wound. Negative for rash.  Neurological:  Negative for speech difficulty and headaches.  All other systems reviewed and are negative.    Physical Exam Triage Vital Signs ED Triage Vitals  Enc Vitals Group     BP 06/14/19 0947 116/77     Pulse Rate 06/14/19 0947 75     Resp 06/14/19 0947 16     Temp 06/14/19 0947 98.2 F (36.8 C)     Temp Source 06/14/19 0947 Oral     SpO2 06/14/19 0947 98 %     Weight --      Height --      Head Circumference --      Peak Flow --      Pain Score 06/14/19 0952 0     Pain Loc --      Pain Edu? --      Excl. in [place]? --    No data found.  Updated Vital Signs BP 116/77 (BP Location: Left Arm)   Pulse 75   Temp 98.2 F (36.8 C) (Oral)   Resp 16   SpO2 98%   Visual Acuity Right Eye Distance:   Left Eye Distance:   Bilateral Distance:    Right Eye Near:   Left Eye Near:    Bilateral Near:     Physical Exam Constitutional:      General: He is not in acute distress. HENT:     Head: Normocephalic and atraumatic.  Eyes:     General: No scleral  icterus.    Pupils: Pupils are equal, round, and reactive to light.  Cardiovascular:     Rate and Rhythm: Normal rate.  Pulmonary:     Effort: Pulmonary effort is normal.  Skin:    Coloration: Skin is not jaundiced or pale.     Comments: 3 mm punctate lesion with overlying granulation tissue.  Surrounded by 1 mm of erythema, mild edema.  Nontender to palpation without active discharge.  Neurological:     Mental Status: He is alert and oriented to person, place, and time.      UC Treatments / Results  Labs (all labs ordered are listed, but only abnormal results are displayed) Labs Reviewed - No data to display  EKG   Radiology No results found.  Procedures Procedures (including critical care time)  Medications Ordered in UC Medications - No data to display  Initial Impression / Assessment and Plan / UC Course  I have reviewed the triage vital signs and the nursing notes.  Pertinent labs & imaging results that were  available during my care of the patient were reviewed by me and considered in my medical decision making (see chart for details).     Lesion appears to be healing well, no sign of infection at this time.  Patient to apply hot compresses 3-4 times daily, otherwise leave alone as this should resolve on its own.  Return precautions discussed, patient verbalized understanding and is agreeable to plan. Final Clinical Impressions(s) / UC Diagnoses   Final diagnoses:  Dermatitis     Discharge Instructions     Leave area clean and dry. Do not pick or mash on the area of concern. May apply hot compresses 3-4 times daily as discussed. Return for worsening pain, swelling, redness, pus, fever.    ED Prescriptions    None     PDMP not reviewed this encounter.   [place], [place], [place] 06/14/19 1015

## 2019-06-14 NOTE — Discharge Instructions (Addendum)
Leave area clean and dry. Do not pick or mash on the area of concern. May apply hot compresses 3-4 times daily as discussed. Return for worsening pain, swelling, redness, pus, fever.
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
## 2019-06-17 ENCOUNTER — Ambulatory Visit (INDEPENDENT_AMBULATORY_CARE_PROVIDER_SITE_OTHER): Payer: Self-pay | Admitting: Licensed Clinical Social Worker

## 2019-06-17 ENCOUNTER — Other Ambulatory Visit: Payer: Self-pay

## 2019-06-17 DIAGNOSIS — F411 Generalized anxiety disorder: Secondary | ICD-10-CM

## 2019-06-17 DIAGNOSIS — F330 Major depressive disorder, recurrent, mild: Secondary | ICD-10-CM
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
## 2019-06-20 ENCOUNTER — Ambulatory Visit
Admission: EM | Admit: 2019-06-20 | Discharge: 2019-06-20 | Disposition: A | Discharge status: Home / Self Care | Payer: Medicaid Other | Attending: Physician Assistant | Admitting: Physician Assistant

## 2019-06-20 DIAGNOSIS — R432 Parageusia: Secondary | ICD-10-CM | POA: Insufficient documentation

## 2019-06-20 DIAGNOSIS — R197 Diarrhea, unspecified: Secondary | ICD-10-CM | POA: Insufficient documentation

## 2019-06-20 DIAGNOSIS — R112 Nausea with vomiting, unspecified: Secondary | ICD-10-CM | POA: Insufficient documentation

## 2019-06-20 DIAGNOSIS — J029 Acute pharyngitis, unspecified: Secondary | ICD-10-CM | POA: Insufficient documentation

## 2019-06-20 HISTORY — DX: Depression, unspecified: F32.A

## 2019-06-20 LAB — POCT RAPID STREP A (OFFICE): Rapid Strep A Screen: NEGATIVE

## 2019-06-20 MED ORDER — ONDANSETRON 4 MG PO TBDP: 4.0000 mg | ORAL_TABLET | Freq: Three times a day (TID) | ORAL | 0 refills | Status: DC | PRN

## 2019-06-20 MED ORDER — DICYCLOMINE HCL 20 MG PO TABS: 20.0000 mg | ORAL_TABLET | Freq: Two times a day (BID) | ORAL | 0 refills | Status: DC

## 2019-06-20 NOTE — Discharge Instructions (Signed)
As discussed, cannot rule out COVID due to current symptoms, testing sent, you will need to quarantine until testing results return. Zofran for nausea and vomiting as needed. Bentyl for abdominal cramping. Keep hydrated, you urine should be clear to pale yellow in color. Bland diet, advance as tolerated. Monitor for any worsening of symptoms, nausea or vomiting not controlled by medication, worsening abdominal pain, fever, shortness of breath, go to the ED for further evaluation needed.

## 2019-06-20 NOTE — ED Provider Notes (Addendum)
[name]    CSN: [id number] Arrival date & time: 06/20/19  1635      History   Chief Complaint Chief Complaint  Patient presents with  . Emesis    HPI Marc Sandoval is a 28 y.o. male.   28 year old male comes in for 2 day history of nausea, vomiting, diarrhea, sore throat, loss of taste, abdominal pain.  Denies fever, chills, body aches.  Denies rhinorrhea, nasal congestion, cough.  Patient has had multiple episodes of nonbilious nonbloody vomit when symptoms first started.  Now averaging 2 episodes every day.  Has been able to tolerate fluid intake since last emesis.  On average 4 episodes of diarrhea per day.  Denies melena, hematochezia.  States abdominal pain is bilateral lower, cramping in sensation mostly associated with vomiting and diarrhea.  No known sick/Covid contact.  About to start a new job.     Past Medical History:  Diagnosis Date  . Anxiety   . Depression   . Hypertension   . Migraines     Patient Active Problem List   Diagnosis Date Noted  . Closed fracture of left ankle 10/13/2018    History reviewed. No pertinent surgical history.     Home Medications    Prior to Admission medications   Medication Sig Start Date End Date Taking? Authorizing Provider  dicyclomine (BENTYL) 20 MG tablet Take 1 tablet (20 mg total) by mouth 2 (two) times daily. 06/20/19   Tasia Catchings,  V, PA-C  ondansetron (ZOFRAN ODT) 4 MG disintegrating tablet Take 1 tablet (4 mg total) by mouth every 8 (eight) hours as needed for nausea or vomiting. 06/20/19   Tasia Catchings,  V, PA-C  PARoxetine (PAXIL) 20 MG tablet Take 1 tablet (20 mg total) by mouth daily. 05/22/19   Charlott Rakes, MD    Family History Family History  Problem Relation Age of Onset  . Healthy Mother   . Healthy Daughter   . Stroke Neg Hx     Social History Social History   Tobacco Use  . Smoking status: Never Smoker  . Smokeless tobacco: Never Used  Substance Use Topics  . Alcohol use: Yes   Comment: occ  . Drug use: Yes    Types: Marijuana     Allergies   Dayquil [pseudoephedrine-apap-dm], Doxylamine-phenylephrine-apap, Doxylamine-phenylephrine-apap, and Nyquil hbp cold & flu [dm-doxylamine-acetaminophen]   Review of Systems Review of Systems  Reason unable to perform ROS: See HPI as above.     Physical Exam Triage Vital Signs ED Triage Vitals  Enc Vitals Group     BP 06/20/19 1644 (!) 143/79     Pulse Rate 06/20/19 1644 79     Resp 06/20/19 1644 18     Temp 06/20/19 1644 98.5 F (36.9 C)     Temp Source 06/20/19 1644 Oral     SpO2 06/20/19 1644 98 %     Weight --      Height --      Head Circumference --      Peak Flow --      Pain Score 06/20/19 1645 5     Pain Loc --      Pain Edu? --      Excl. in [place]? --    No data found.  Updated Vital Signs BP (!) 143/79 (BP Location: Left Arm)   Pulse 79   Temp 98.5 F (36.9 C) (Oral)   Resp 18   SpO2 98%   Physical Exam Constitutional:  General: He is not in acute distress.    Appearance: Normal appearance. He is not ill-appearing, toxic-appearing or diaphoretic.  HENT:     Head: Normocephalic and atraumatic.     Mouth/Throat:     Mouth: Mucous membranes are moist.     Pharynx: Oropharynx is clear. Uvula midline. Posterior oropharyngeal erythema present.     Tonsils: Tonsillar exudate present. 2+ on the right. 2+ on the left.  Neck:     Musculoskeletal: Normal range of motion and neck supple.  Cardiovascular:     Rate and Rhythm: Normal rate and regular rhythm.     Heart sounds: Normal heart sounds. No murmur. No friction rub. No gallop.   Pulmonary:     Effort: Pulmonary effort is normal. No accessory muscle usage, prolonged expiration, respiratory distress or retractions.     Comments: Lungs clear to auscultation without adventitious lung sounds. Abdominal:     General: Bowel sounds are normal.     Palpations: Abdomen is soft.     Tenderness: There is no abdominal tenderness. There is no  guarding or rebound.  Neurological:     General: No focal deficit present.     Mental Status: He is alert and oriented to person, place, and time.    UC Treatments / Results  Labs (all labs ordered are listed, but only abnormal results are displayed) Labs Reviewed  NOVEL CORONAVIRUS, NAA  CULTURE, GROUP A STREP [facility])  POCT RAPID STREP A (OFFICE)    EKG   Radiology No results found.  Procedures Procedures (including critical care time)  Medications Ordered in UC Medications - No data to display  Initial Impression / Assessment and Plan / UC Course  I have reviewed the triage vital signs and the nursing notes.  Pertinent labs & imaging results that were available during my care of the patient were reviewed by me and considered in my medical decision making (see chart for details).    Rapid strep negative. COVID testing sent. Patient to remain in quarantine until testing results return. Symptomatic treatment discussed. Push fluids. Bland diet, advance as tolerated. Return precautions given. Patient expresses understanding and agrees to plan.  Final Clinical Impressions(s) / UC Diagnoses   Final diagnoses:  Sore throat  Loss of taste  Nausea vomiting and diarrhea   ED Prescriptions    Medication Sig Dispense Auth. Provider   ondansetron (ZOFRAN ODT) 4 MG disintegrating tablet Take 1 tablet (4 mg total) by mouth every 8 (eight) hours as needed for nausea or vomiting. 20 tablet ,  V, PA-C   dicyclomine (BENTYL) 20 MG tablet Take 1 tablet (20 mg total) by mouth 2 (two) times daily. 20 tablet Belinda Fisher, PA-C     PDMP not reviewed this encounter.   Belinda Fisher, PA-C 06/20/19 1721    Belinda Fisher, PA-C 06/20/19 1934

## 2019-06-20 NOTE — ED Triage Notes (Signed)
Pt c/o lower abdominal pain with vomiting and diarrhea since Wednesday. States able to keep some fluids down. Vomit x2 today.

## 2019-06-21 ENCOUNTER — Telehealth: Payer: Self-pay | Admitting: Emergency Medicine

## 2019-06-21 NOTE — Telephone Encounter (Signed)
Checked in on patient, discussed medications, and encouraged return call with any continuing questions or concerns.    Patient states he is having trouble with fund to pick up medications.  This RN recommended [employer] and asked patient to look over the website and call me back with which pharmacies work best price wise/location wise for him, and I will send the prescriptions to his new pharmacies.

## 2019-06-22 LAB — NOVEL CORONAVIRUS, NAA: SARS-CoV-2, NAA: NOT DETECTED

## 2019-06-24 LAB — CULTURE, GROUP A STREP (THRC)

## 2019-07-01 ENCOUNTER — Ambulatory Visit
Admission: EM | Admit: 2019-07-01 | Discharge: 2019-07-01 | Disposition: A | Discharge status: Home / Self Care | Payer: Medicaid Other | Attending: Nurse Practitioner | Admitting: Nurse Practitioner

## 2019-07-01 DIAGNOSIS — Z202 Contact with and (suspected) exposure to infections with a predominantly sexual mode of transmission: Secondary | ICD-10-CM | POA: Insufficient documentation

## 2019-07-01 DIAGNOSIS — Z7251 High risk heterosexual behavior: Secondary | ICD-10-CM | POA: Insufficient documentation
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
## 2019-07-01 NOTE — ED Provider Notes (Addendum)
[name]    CSN: [id number] Arrival date & time: 07/01/19  1730      History   Chief Complaint Chief Complaint  Patient presents with   Exposure to STD    HPI Marc Sandoval is a 28 y.o. male.   Subjective:  Marc Sandoval is a 28 y.o. male who presents for possible STD exposure and STD testing. Patient denies any symptoms at this time, specifically, no burning with urination, dysuria, frequency, inability to void, incomplete bladder emptying or nausea. Patient denies any history of STDs. He is sexually active with both male and male partners. He does not use condoms routinely.   The following portions of the patient's history were reviewed and updated as appropriate: allergies, current medications, past family history, past medical history, past social history, past surgical history and problem list.        Past Medical History:  Diagnosis Date   Anxiety    Depression    Hypertension    Migraines     Patient Active Problem List   Diagnosis Date Noted   Closed fracture of left ankle 10/13/2018    History reviewed. No pertinent surgical history.     Home Medications    Prior to Admission medications   Medication Sig Start Date End Date Taking? Authorizing Provider  dicyclomine (BENTYL) 20 MG tablet Take 1 tablet (20 mg total) by mouth 2 (two) times daily. 06/20/19   Cathie Hoops, Amy V, PA-C  ondansetron (ZOFRAN ODT) 4 MG disintegrating tablet Take 1 tablet (4 mg total) by mouth every 8 (eight) hours as needed for nausea or vomiting. 06/20/19   Cathie Hoops, Amy V, PA-C  PARoxetine (PAXIL) 20 MG tablet Take 1 tablet (20 mg total) by mouth daily. 05/22/19   Hoy Register, MD    Family History Family History  Problem Relation Age of Onset   Healthy Mother    Healthy Daughter    Stroke Neg Hx     Social History Social History   Tobacco Use   Smoking status: Never Smoker   Smokeless tobacco: Never Used  Substance Use Topics   Alcohol use:  Yes    Comment: occ   Drug use: Yes    Types: Marijuana     Allergies   Dayquil [pseudoephedrine-apap-dm], Doxylamine-phenylephrine-apap, Doxylamine-phenylephrine-apap, and Nyquil hbp cold & flu [dm-doxylamine-acetaminophen]   Review of Systems Review of Systems  Genitourinary: Negative for difficulty urinating, discharge, dysuria, flank pain, frequency, penile pain, penile swelling, scrotal swelling and testicular pain.  Musculoskeletal: Negative for back pain.  All other systems reviewed and are negative.    Physical Exam Triage Vital Signs ED Triage Vitals  Enc Vitals Group     BP 07/01/19 1740 131/83     Pulse Rate 07/01/19 1740 73     Resp --      Temp 07/01/19 1740 98.4 F (36.9 C)     Temp Source 07/01/19 1740 Oral     SpO2 07/01/19 1740 98 %     Weight 07/01/19 1737 163 lb 12.8 oz (74.3 kg)     Height --      Head Circumference --      Peak Flow --      Pain Score 07/01/19 1737 0     Pain Loc --      Pain Edu? --      Excl. in [place]? --    No data found.  Updated Vital Signs BP 131/83 (BP Location: Left Arm)    Pulse 73  Temp 98.4 F (36.9 C) (Oral)    Wt 163 lb 12.8 oz (74.3 kg)    SpO2 98%    BMI 20.47 kg/m   Visual Acuity Right Eye Distance:   Left Eye Distance:   Bilateral Distance:    Right Eye Near:   Left Eye Near:    Bilateral Near:     Physical Exam Vitals signs reviewed. Exam conducted with a chaperone present.  Constitutional:      Appearance: Normal appearance.  HENT:     Head: Normocephalic.  Neck:     Musculoskeletal: Normal range of motion and neck supple.  Cardiovascular:     Rate and Rhythm: Normal rate and regular rhythm.  Pulmonary:     Effort: Pulmonary effort is normal.     Breath sounds: Normal breath sounds.  Genitourinary:    Penis: Normal and circumcised. No erythema, discharge, swelling or lesions.   Musculoskeletal: Normal range of motion.  Skin:    General: Skin is warm and dry.  Neurological:     General:  No focal deficit present.     Mental Status: He is alert and oriented to person, place, and time.  Psychiatric:        Mood and Affect: Mood normal.        Behavior: Behavior normal.      UC Treatments / Results  Labs (all labs ordered are listed, but only abnormal results are displayed) Labs Reviewed  HIV ANTIBODY (ROUTINE TESTING W REFLEX)  RPR  CYTOLOGY, (ORAL, ANAL, URETHRAL) ANCILLARY ONLY    EKG   Radiology No results found.  Procedures Procedures (including critical care time)  Medications Ordered in UC Medications - No data to display  Initial Impression / Assessment and Plan / UC Course  I have reviewed the triage vital signs and the nursing notes.  Pertinent labs & imaging results that were available during my care of the patient were reviewed by me and considered in my medical decision making (see chart for details).    28 y.o. male presenting for possible STD exposure and STD testing. He currently has no symptoms. He is sexually active with both male and male partners and does not routinely use condoms. No prior history of STDs. Patient was swabbed for GC/chlamydia and trichomonas. Blood draw for HIV and RPR. Safe sex practices discussed as well as the need for routine STD testing given his high risk sexual behaviors.   Today's evaluation has revealed no signs of a dangerous process. Discussed diagnosis with patient and/or guardian. Patient and/or guardian aware of their diagnosis, possible red flag symptoms to watch out for and need for close follow up. Patient and/or guardian understands verbal and written discharge instructions. Patient and/or guardian comfortable with plan and disposition.  Patient and/or guardian has a clear mental status at this time, good insight into illness (after discussion and teaching) and has clear judgment to make decisions regarding their care  This care was provided during an unprecedented National Emergency due to the Novel  Coronavirus (COVID-19) pandemic. COVID-19 infections and transmission risks place heavy strains on healthcare resources.  As this pandemic evolves, our facility, providers, and staff strive to respond fluidly, to remain operational, and to provide care relative to available resources and information. Outcomes are unpredictable and treatments are without well-defined guidelines. Further, the impact of COVID-19 on all aspects of urgent care, including the impact to patients seeking care for reasons other than COVID-19, is unavoidable during this national emergency. At this time  of the global pandemic, management of patients has significantly changed, even for non-COVID positive patients given high local and regional COVID volumes at this time requiring high healthcare system and resource utilization. The standard of care for management of both COVID suspected and non-COVID suspected patients continues to change rapidly at the local, regional, national, and global levels. This patient was worked up and treated to the best available but ever changing evidence and resources available at this current time.   Documentation was completed with the aid of voice recognition software. Transcription may contain typographical errors.  Final Clinical Impressions(s) / UC Diagnoses   Final diagnoses:  Possible exposure to STD  High risk sexual behavior, unspecified type     Discharge Instructions     You will only be notified for positive results. You may go online to MyChart in the next few days and review your results. You should follow up with your PCP or the health department for routine HIV testing, syphilis screening, and gonorrhea and chlamydia screening at pharyngeal, urogenital, and rectal sites every three months.     ED Prescriptions    None     PDMP not reviewed this encounter.   Enrique Sack, [place] 07/01/19 [place], [place], [place] 07/01/19 [phone]

## 2019-07-01 NOTE — Discharge Instructions (Signed)
You will only be notified for positive results. You may go online to MyChart in the next few days and review your results. You should follow up with your PCP or the health department for routine HIV testing, syphilis screening, and gonorrhea and chlamydia screening at pharyngeal, urogenital, and rectal sites every three months.

## 2019-07-03 LAB — RPR: RPR Ser Ql: NONREACTIVE

## 2019-07-03 LAB — HIV ANTIBODY (ROUTINE TESTING W REFLEX): HIV Screen 4th Generation wRfx: NONREACTIVE

## 2019-07-04 LAB — CYTOLOGY, (ORAL, ANAL, URETHRAL) ANCILLARY ONLY
Chlamydia: NEGATIVE
Neisseria Gonorrhea: NEGATIVE
Trichomonas: NEGATIVE

## 2019-07-11 ENCOUNTER — Telehealth: Payer: Self-pay | Admitting: *Deleted

## 2019-07-11 ENCOUNTER — Ambulatory Visit
Admission: EM | Admit: 2019-07-11 | Discharge: 2019-07-11 | Disposition: A | Discharge status: Home / Self Care | Payer: Medicaid Other

## 2019-07-11 DIAGNOSIS — Z206 Contact with and (suspected) exposure to human immunodeficiency virus [HIV]: Secondary | ICD-10-CM

## 2019-07-11 DIAGNOSIS — Z202 Contact with and (suspected) exposure to infections with a predominantly sexual mode of transmission: Secondary | ICD-10-CM

## 2019-07-11 DIAGNOSIS — Z7251 High risk heterosexual behavior: Secondary | ICD-10-CM

## 2019-07-11 NOTE — ED Triage Notes (Signed)
Pt states his partner was test positive for syphilis after he was tested last Friday and had a neg test. Pt c/o throat tenderness and a bump on his lip today.

## 2019-07-11 NOTE — Telephone Encounter (Signed)
Received call from [place], [place] at Urgent Care, to connect patient to PrEP clinic, as they are unable to make referrals from their office. Patient has had recent unprotected exposure. His last HIV test was 07/01/19 (4th gen), result was negative.  Patient is uninsured, is outside of 72 hour window for post exposure.  RN made appointment with Cassie for PrEP clinic on 11/10 10:00.  [place] will relay appointment information to patient. Landis Gandy, RN

## 2019-07-11 NOTE — Discharge Instructions (Addendum)
Please avoid all forms of intercourse with any and all partners until your appointment on Tuesday 11/10 at 10 AM.

## 2019-07-11 NOTE — ED Provider Notes (Addendum)
[name]    CSN: [id number] Arrival date & time: 07/11/19  1411      History   Chief Complaint Chief Complaint  Patient presents with  . Exposure to STD    HPI Marc Sandoval is a 28 y.o. male with history of hypertension, anxiety, depression presenting for STD testing.  Patient has had a monogamous relationship with a male partner for the last 2 months: Occasionally using condoms.  States he found out today that this partner, who underwent testing on Monday 11/2, found out he is positive for both syphilis and HIV today.  Patient underwent STD screening on 10/27: HIV, RPR, G/C, trichomonas negative.  Patient denies symptoms at this time: Has not taken PrEP, PEP in the past.  Patient endorsing a lot of stress and anxiety around this as his uncle recently passed away and had cancer, HIV.  Denies SI/HI at this time.   Past Medical History:  Diagnosis Date  . Anxiety   . Depression   . Hypertension   . Migraines     Patient Active Problem List   Diagnosis Date Noted  . Closed fracture of left ankle 10/13/2018    History reviewed. No pertinent surgical history.     Home Medications    Prior to Admission medications   Medication Sig Start Date End Date Taking? Authorizing Provider  dicyclomine (BENTYL) 20 MG tablet Take 1 tablet (20 mg total) by mouth 2 (two) times daily. 06/20/19   Cathie Hoops, Amy V, PA-C  ondansetron (ZOFRAN ODT) 4 MG disintegrating tablet Take 1 tablet (4 mg total) by mouth every 8 (eight) hours as needed for nausea or vomiting. 06/20/19   Cathie Hoops, Amy V, PA-C  PARoxetine (PAXIL) 20 MG tablet Take 1 tablet (20 mg total) by mouth daily. 05/22/19   Hoy Register, MD    Family History Family History  Problem Relation Age of Onset  . Healthy Mother   . Healthy Daughter   . Stroke Neg Hx     Social History Social History   Tobacco Use  . Smoking status: Never Smoker  . Smokeless tobacco: Never Used  Substance Use Topics  . Alcohol use: Yes   Comment: occ  . Drug use: Yes    Types: Marijuana     Allergies   Dayquil [pseudoephedrine-apap-dm], Doxylamine-phenylephrine-apap, Doxylamine-phenylephrine-apap, and Nyquil hbp cold & flu [dm-doxylamine-acetaminophen]   Review of Systems Review of Systems  Constitutional: Negative for fatigue and fever.  Respiratory: Negative for cough and shortness of breath.   Cardiovascular: Negative for chest pain and palpitations.  Gastrointestinal: Negative for abdominal pain, diarrhea and vomiting.  Genitourinary: Negative for discharge, dysuria, frequency, penile pain, penile swelling, scrotal swelling and testicular pain.  Musculoskeletal: Negative for arthralgias and myalgias.  Skin: Negative for rash and wound.  Neurological: Negative for speech difficulty and headaches.  All other systems reviewed and are negative.    Physical Exam Triage Vital Signs ED Triage Vitals  Enc Vitals Group     BP 07/11/19 1436 (!) 149/85     Pulse Rate 07/11/19 1436 71     Resp 07/11/19 1436 16     Temp 07/11/19 1436 98.4 F (36.9 C)     Temp Source 07/11/19 1436 Oral     SpO2 07/11/19 1436 97 %     Weight --      Height --      Head Circumference --      Peak Flow --      Pain Score 07/11/19  1442 0     Pain Loc --      Pain Edu? --      Excl. in [place]? --    No data found.  Updated Vital Signs BP (!) 149/85 (BP Location: Left Arm)   Pulse 71   Temp 98.4 F (36.9 C) (Oral)   Resp 16   SpO2 97%   Visual Acuity Right Eye Distance:   Left Eye Distance:   Bilateral Distance:    Right Eye Near:   Left Eye Near:    Bilateral Near:     Physical Exam Constitutional:      General: He is not in acute distress. HENT:     Head: Normocephalic and atraumatic.  Eyes:     General: No scleral icterus.    Pupils: Pupils are equal, round, and reactive to light.  Cardiovascular:     Rate and Rhythm: Normal rate.  Pulmonary:     Effort: Pulmonary effort is normal. No respiratory distress.      Breath sounds: No wheezing.  Skin:    Coloration: Skin is not jaundiced or pale.  Neurological:     Mental Status: He is alert and oriented to person, place, and time.      UC Treatments / Results  Labs (all labs ordered are listed, but only abnormal results are displayed) Labs Reviewed - No data to display  EKG   Radiology No results found.  Procedures Procedures (including critical care time)  Medications Ordered in UC Medications - No data to display  Initial Impression / Assessment and Plan / UC Course  I have reviewed the triage vital signs and the nursing notes.  Pertinent labs & imaging results that were available during my care of the patient were reviewed by me and considered in my medical decision making (see chart for details).     Reviewed case with Dr. Joseph Art who agrees with A/P: Patient has had a 72-hour window for PEP, will have patient follow-up with ID.  This provider coordinated care: Reviewed case with Lenna Sciara, clinical RN at [place]: Appointment scheduled for Tuesday 11/10 at 10 AM.  Repeat testing TBD at that time, not needed in the interim.  Patient likely will receive PrEP at that time/the day after.  Relayed appointment date/time/location with patient who verbalized understanding: Intends to keep this appointment.  Patient to avoid intercourse in the interim.  Return precautions discussed, patient verbalized understanding and is agreeable to plan. Final Clinical Impressions(s) / UC Diagnoses   Final diagnoses:  Unprotected sex  Exposure to syphilis  Exposure to HIV     Discharge Instructions     Please avoid all forms of intercourse with any and all partners until your appointment on Tuesday 11/10 at 10 AM.    ED Prescriptions    None     PDMP not reviewed this encounter.   Hall[place], [place], PA-C 07/11/19 1808    Neldon Mc Golden, [place] 07/11/19 1809

## 2019-07-15 ENCOUNTER — Ambulatory Visit (INDEPENDENT_AMBULATORY_CARE_PROVIDER_SITE_OTHER): Payer: Self-pay | Admitting: Pharmacist

## 2019-07-15 ENCOUNTER — Other Ambulatory Visit: Payer: Self-pay

## 2019-07-15 ENCOUNTER — Telehealth: Payer: Self-pay | Admitting: Pharmacy Technician

## 2019-07-15 DIAGNOSIS — Z7252 High risk homosexual behavior: Secondary | ICD-10-CM

## 2019-07-15 NOTE — Progress Notes (Signed)
Date:  07/15/2019   HPI: Marc Sandoval is a 28 y.o. male who [facility] to discuss and initiate PrEP.  Insured   []    Uninsured  [x]    Patient Active Problem List   Diagnosis Date Noted   Closed fracture of left ankle 10/13/2018    Patient's Medications  New Prescriptions   No medications on file  Previous Medications   DICYCLOMINE (BENTYL) 20 MG TABLET    Take 1 tablet (20 mg total) by mouth 2 (two) times daily.   ONDANSETRON (ZOFRAN ODT) 4 MG DISINTEGRATING TABLET    Take 1 tablet (4 mg total) by mouth every 8 (eight) hours as needed for nausea or vomiting.   PAROXETINE (PAXIL) 20 MG TABLET    Take 1 tablet (20 mg total) by mouth daily.  Modified Medications   No medications on file  Discontinued Medications   No medications on file    Allergies: Allergies  Allergen Reactions   Dayquil [Pseudoephedrine-Apap-Dm] Hives   Doxylamine-Phenylephrine-Apap Hives   Doxylamine-Phenylephrine-Apap Hives   Nyquil Hbp Cold & Flu [Dm-Doxylamine-Acetaminophen] Hives    Past Medical History: Past Medical History:  Diagnosis Date   Anxiety    Depression    Hypertension    Migraines     Social History: Social History   Socioeconomic History   Marital status: Single    Spouse name: Not on file   Number of children: Not on file   Years of education: Not on file   Highest education level: Not on file  Occupational History   Not on file  Social Needs   Financial resource strain: Not on file   Food insecurity    Worry: Not on file    Inability: Not on file   Transportation needs    Medical: Not on file    Non-medical: Not on file  Tobacco Use   Smoking status: Never Smoker   Smokeless tobacco: Never Used  Substance and Sexual Activity   Alcohol use: Yes    Comment: occ   Drug use: Yes    Types: Marijuana   Sexual activity: Not on file  Lifestyle   Physical activity    Days per week: Not on file    Minutes per  session: Not on file   Stress: Not on file  Relationships   Social connections    Talks on phone: Not on file    Gets together: Not on file    Attends religious service: Not on file    Active member of club or organization: Not on file    Attends meetings of clubs or organizations: Not on file    Relationship status: Not on file  Other Topics Concern   Not on file  Social History Narrative   ** Merged History Encounter **        CHL HIV PREP FLOWSHEET RESULTS 07/15/2019  Insurance Status Uninsured  Gender at birth Male  Gender identity cis-Male  Risk for HIV In sexual relationship with HIV+ partner;Condomless vaginal or anal intercourse;Hx of STI  Sex Partners Both men and women  # sex partners past 3-6 mos 1-3  Sex activity preferences Insertive;Oral  Condom use No  Treated for STI? No  HIV symptoms? N/A  PrEP Eligibility Substantial risk for HIV    Labs:  SCr: Lab Results  Component Value Date   CREATININE 1.03 05/22/2019   CREATININE 0.93 10/09/2018   CREATININE 1.42 (H) 10/07/2018   CREATININE 0.93 08/11/2018  HIV Lab Results  Component Value Date   HIV Non Reactive 07/01/2019   HIV Non Reactive 10/09/2018   Hepatitis B No results found for: HEPBSAB, HEPBSAG, HEPBCAB Hepatitis C No results found for: HEPCAB, HCVRNAPCRQN Hepatitis A No results found for: HAV RPR and STI Lab Results  Component Value Date   LABRPR Non Reactive 07/01/2019   LABRPR Non Reactive 10/09/2018    STI Results GC CT  07/01/2019 Negative Negative  10/09/2018 - Negative    Assessment: Marc Sandoval is new to the clinic, here today to discuss initiation of PrEP. He has heard of PrEP in the past and knows it is to help prevent people from becoming infected with HIV. I explained to him that we have two medications that we use for PrEP, Truvada and Descovy and they are both effective. However, since Descovy is associated with lower rate of kidney and bone toxicity we will start him on  this medication. These medications are generally very well tolerated but some common adverse events include headache, nausea and abdominal pain. He was educated that he should take Descovy daily with or without food and how important adherence is for the medication to be effective.   Marc Sandoval is a good candidate for PrEP being an african [place] male who has sex with men. He is currently with a partner who is newly HIV positive and not on treatment yet. He reports engaging in insertive and oral sex and using condoms occasionally. He has recently only been in relationships with men, 2 in the past 6 months, but has had relationships with women in the past. He does not use IV drugs or drugs of any kind.  It was explained that we will follow up with him monthly after he starts his PrEP to ensure he is tolerating the medication and that he is HIV negative. Eventually we will follow up every 3 months. I told him it is important for [place] to verify he is HIV negative before we send in his PrEP prescription because if he were to ever become infected with HIV, this medication would not be enough to treat him. We have applied for patient assistance through [place] since he does not have insurance and is not working at the moment. He was laid off his job due to [employer]. He has been approved for his first 30 days and the application for year long coverage has been submitted. When we get his labs back tomorrow we will give him a call to inform him of the results and coordinate where he will pick up his medication.  Plan: - Descovy x1 month if HIV antibody negative - HepA antibody, HepC antibody, BMET, HIV antibody, HepBsAg and Ab - Follow up in a month 12/9 with Cassie    Jettie Pagan, PharmD PGY2 Infectious Disease Pharmacy Resident  [facility] 07/15/2019, 10:50 AM

## 2019-07-15 NOTE — Telephone Encounter (Addendum)
[facility] Patient Advocate Encounter   Patient has been approved for [employer] Advancing Access Patient Assistance Program for [place] for 07/15/2019 to 07/14/2020. This assistance will make the patient's copay $0.  The billing information is as follows:  Member ID: [id number] [place]: [id number] PCN: [id number] Group: [id number]  We will reach out to the patient when labs return and see about filling with [facility]. Patient knows to call the office with questions or concerns. Update: was filled at [facility] on 07/16/2019.  Bartholomew Crews, CPhT Specialty Pharmacy Patient [facility] Phone: [phone] Fax: [phone] 07/15/2019 2:41 PM

## 2019-07-16 ENCOUNTER — Other Ambulatory Visit: Payer: Self-pay | Admitting: Pharmacist

## 2019-07-16 DIAGNOSIS — Z7252 High risk homosexual behavior: Secondary | ICD-10-CM

## 2019-07-16 MED ORDER — DESCOVY 200-25 MG PO TABS: 1.0000 | ORAL_TABLET | Freq: Every day | ORAL | 0 refills | Status: DC

## 2019-07-16 MED FILL — DESCOVY 200-25 MG TABS: 200-25 | 30 days supply | Qty: 30 | Fill #0

## 2019-07-16 NOTE — Progress Notes (Signed)
None that I know of. No history of tattoos, IVDU, or transfusions!

## 2019-07-16 NOTE — Progress Notes (Signed)
Looks like my new PrEP patient my have active Hepatitis C

## 2019-07-20 LAB — HCV RNA,QUANTITATIVE REAL TIME PCR
HCV Quantitative Log: <1.18 Log IU/mL
HCV RNA, PCR, QN: <15 IU/mL

## 2019-07-20 LAB — HEPATITIS C ANTIBODY
Hepatitis C Ab: REACTIVE — AB
SIGNAL TO CUT-OFF: 1.49 — ABNORMAL HIGH (ref <1.00)

## 2019-07-20 LAB — HIV ANTIBODY (ROUTINE TESTING W REFLEX): HIV 1&2 Ab, 4th Generation: NONREACTIVE

## 2019-07-20 LAB — BASIC METABOLIC PANEL
BUN: 14 mg/dL (ref 7–25)
CO2: 30 mmol/L (ref 20–32)
Calcium: 9.6 mg/dL (ref 8.6–10.3)
Chloride: 104 mmol/L (ref 98–110)
Creat: 0.97 mg/dL (ref 0.60–1.35)
Glucose, Bld: 97 mg/dL (ref 65–99)
Potassium: 4.1 mmol/L (ref 3.5–5.3)
Sodium: 141 mmol/L (ref 135–146)

## 2019-07-20 LAB — HEPATITIS A ANTIBODY, TOTAL: Hepatitis A AB,Total: REACTIVE — AB

## 2019-07-20 LAB — HEPATITIS B SURFACE ANTIBODY,QUALITATIVE: Hep B S Ab: REACTIVE — AB

## 2019-07-20 LAB — HEPATITIS B SURFACE ANTIGEN: Hepatitis B Surface Ag: NONREACTIVE

## 2019-07-24 ENCOUNTER — Ambulatory Visit: Payer: Medicaid Other

## 2019-08-12 ENCOUNTER — Other Ambulatory Visit: Payer: Self-pay | Admitting: Pharmacist

## 2019-08-12 DIAGNOSIS — Z7252 High risk homosexual behavior: Secondary | ICD-10-CM

## 2019-08-13 ENCOUNTER — Other Ambulatory Visit: Payer: Self-pay

## 2019-08-13 ENCOUNTER — Ambulatory Visit (INDEPENDENT_AMBULATORY_CARE_PROVIDER_SITE_OTHER): Payer: Self-pay | Admitting: Pharmacist

## 2019-08-13 ENCOUNTER — Telehealth: Payer: Self-pay | Admitting: Pharmacy Technician

## 2019-08-13 DIAGNOSIS — Z7252 High risk homosexual behavior: Secondary | ICD-10-CM

## 2019-08-13 LAB — HIV ANTIBODY (ROUTINE TESTING W REFLEX): HIV 1&2 Ab, 4th Generation: NONREACTIVE

## 2019-08-13 NOTE — Telephone Encounter (Signed)
[facility]ID Patient Advocate Encounter  Met with the patient today before their lab appointment to see how things were going with their Descovy. He has 3 tablets remaining and has not missed a dose, last picked up 07/16/19. He has no complaints and tolerating the medicine well. The patient stated he did extensive research on the medication and was pleased with what he read.   He gets his medication filled at [facility] and would like to have his refills mailed to his home address: [name], [name], [place] [address]  Best number to reach him for the follow-up phone call is [phone] Appointment has been made for 11/11/2019 '@09' :15 am. He can be reached at any time during the day but prefers morning.   Bartholomew Crews, CPhT Specialty Pharmacy Patient [facility] for Infectious Disease Phone: [phone] Fax: [phone] 08/13/2019 9:34 AM

## 2019-08-14 ENCOUNTER — Other Ambulatory Visit: Payer: Self-pay | Admitting: Pharmacist

## 2019-08-14 DIAGNOSIS — Z7252 High risk homosexual behavior: Secondary | ICD-10-CM

## 2019-08-14 MED ORDER — DESCOVY 200-25 MG PO TABS: 1.0000 | ORAL_TABLET | Freq: Every day | ORAL | 2 refills | Status: DC

## 2019-08-14 MED FILL — DESCOVY 200-25 MG TABS: 200-25 | 30 days supply | Qty: 30 | Fill #0
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
## 2019-08-27 ENCOUNTER — Ambulatory Visit: Payer: Medicaid Other | Admitting: Licensed Clinical Social Worker

## 2019-08-27 ENCOUNTER — Telehealth: Payer: Self-pay | Admitting: Licensed Clinical Social Worker

## 2019-08-27 ENCOUNTER — Encounter: Payer: Self-pay | Admitting: Family Medicine

## 2019-08-27 NOTE — Telephone Encounter (Signed)
Call placed to patient regarding IBH appointment. LCSW left message requesting a return call.  °

## 2019-09-02 ENCOUNTER — Other Ambulatory Visit: Payer: Self-pay

## 2019-09-02 ENCOUNTER — Ambulatory Visit
Admission: EM | Admit: 2019-09-02 | Discharge: 2019-09-02 | Disposition: A | Discharge status: Home / Self Care | Payer: Medicaid Other

## 2019-09-02 ENCOUNTER — Ambulatory Visit (INDEPENDENT_AMBULATORY_CARE_PROVIDER_SITE_OTHER): Payer: Medicaid Other | Admitting: Licensed Clinical Social Worker

## 2019-09-02 DIAGNOSIS — F4323 Adjustment disorder with mixed anxiety and depressed mood: Secondary | ICD-10-CM

## 2019-09-02 DIAGNOSIS — F4321 Adjustment disorder with depressed mood: Secondary | ICD-10-CM

## 2019-09-02 NOTE — ED Notes (Signed)
Attempted to call patient to bring back to room for assessment, no answer.

## 2019-09-02 NOTE — ED Notes (Signed)
Marc Sandoval, patient access, attempted to call patient twice, left a voicemail, no answer

## 2019-09-02 NOTE — ED Notes (Signed)
Megan, RT able to get patient on phone, and patient states he decided to leave and will come back to be seen tomorrow.  Will d/c.

## 2019-09-02 NOTE — ED Notes (Signed)
Marc Sandoval, patient access, walked to parking lot to look for patient, did not see him, no one emerged from their car.  Will move back to lobby and attempt after next few triages

## 2019-09-03 ENCOUNTER — Telehealth: Payer: Medicaid Other

## 2019-09-08 MED FILL — DESCOVY 200-25 MG TABS: 200-25 | 30 days supply | Qty: 30 | Fill #1

## 2019-09-11 NOTE — BH Specialist Note (Signed)
Integrated Behavioral Health Follow Up Visit  MRN: [id number] Name: Marc Sandoval  Number of Integrated Behavioral Health Clinician visits: 2/6 Session Start time: 2:40 PM  Session End time: 3:40 PM Total time: 60  Type of Service: Integrated Behavioral Health- Individual Interpretor:No. Interpretor Name and Language: NA  SUBJECTIVE: Marc Sandoval is a 29 y.o. male accompanied by self Patient was referred by Dr. Alvis Lemmings for depression and anxiety. Patient reports the following symptoms/concerns: Pt reports increase in depression and anxiety symptoms with passive suicidal ideations. Pt denies plan or intent to harm self or others Duration of problem: Ongoing; Severity of problem: severe  OBJECTIVE: Mood: Appropriate and Affect: Appropriate Risk of harm to self or others: No plan to harm self or others  LIFE CONTEXT: Family and Social: Pt receives limited support from family and friends.  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  Reduce symptoms of: agitation, anxiety, depression and stress  2.  Increase knowledge and/or ability of: self-management skills  3.  Demonstrate ability to: Increase healthy adjustment to current life circumstances, Increase adequate support systems for patient/family and Begin healthy grieving over loss  INTERVENTIONS: Interventions utilized:  Solution-Focused Strategies, Mindfulness or Management consultant, Supportive Counseling and Psychoeducation and/or Health Education Standardized Assessments completed: GAD-7 and PHQ 2&9  ASSESSMENT: Patient currently experiencing psychosocial stressors triggering increase in depression and anxiety symptoms. He is grieving the loss of family, in addition, to stress from relationship. Pt reports hx of passive  suicidal ideations; however, denies plan or intent to harm self or others. Pt aware of crisis intervention resources in the community.  Patient may benefit from medication management and therapy. Strategies to encouraged self-care and establishing healthy boundaries discussed. Pt plans to request medication management at upcoming appointment with Provider scheduled on 09/03/19  PLAN: 1. Follow up with behavioral health clinician on : Follow up with LCSW for behavioral health and/or resource needs 2. Behavioral recommendations: Utilize strategies discussed in session and initiate medication management through PCP 3. Referral(s): Integrated Behavioral Health Services (In Clinic) 4. "From scale of 1-10, how likely are you to follow plan?":   Bridgett Larsson, LCSW 09/11/2019 5:04 PM

## 2019-09-18 ENCOUNTER — Emergency Department (HOSPITAL_COMMUNITY)
Admission: EM | Admit: 2019-09-18 | Discharge: 2019-09-18 | Discharge status: Absent without leave | Payer: Medicaid Other

## 2019-09-18 ENCOUNTER — Ambulatory Visit (INDEPENDENT_AMBULATORY_CARE_PROVIDER_SITE_OTHER): Payer: Self-pay

## 2019-09-18 ENCOUNTER — Other Ambulatory Visit: Payer: Self-pay

## 2019-09-18 ENCOUNTER — Ambulatory Visit
Admission: EM | Admit: 2019-09-18 | Discharge: 2019-09-18 | Disposition: A | Discharge status: Home / Self Care | Payer: Self-pay | Attending: Physician Assistant | Admitting: Physician Assistant

## 2019-09-18 DIAGNOSIS — R0781 Pleurodynia: Secondary | ICD-10-CM

## 2019-09-18 DIAGNOSIS — R0782 Intercostal pain: Secondary | ICD-10-CM

## 2019-09-18 MED ORDER — MELOXICAM 7.5 MG PO TABS: 7.5000 mg | ORAL_TABLET | Freq: Every day | ORAL | 0 refills | Status: DC
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
## 2019-09-18 NOTE — Discharge Instructions (Addendum)
Xray is negative for rib fractures. Start Mobic. Do not take ibuprofen (motrin/advil)/ naproxen (aleve) while on mobic. Ice compress, rest. If worsening symptoms, abdominal pain, nausea/vomiting, blood in urine/stool, go to the ED for further evaluation needed. Otherwise, follow up with PCP for recheck/reevaluation.

## 2019-09-26 ENCOUNTER — Telehealth: Payer: Self-pay

## 2019-09-26 NOTE — Telephone Encounter (Signed)
Patient contacted and made aware of Cassie's advise. No other questions or concerns voiced from patient. Marc Sandoval

## 2019-09-26 NOTE — Telephone Encounter (Signed)
Thanks Shaquenia. It likely isn't from him Descovy. If he wants to be tested for STDs, he will need to call the health department for additional testing. Thank you!

## 2019-09-26 NOTE — Telephone Encounter (Signed)
Patient states he has a new area on his hand and is concerned that the skin changes could be related to his medication. Patient states he is no longer with his most recent partner. Also asked patient to send picture via MyChart. Patient would like additional testing. Valarie Cones

## 2019-09-30 ENCOUNTER — Other Ambulatory Visit: Payer: Self-pay

## 2019-09-30 ENCOUNTER — Ambulatory Visit (INDEPENDENT_AMBULATORY_CARE_PROVIDER_SITE_OTHER): Payer: Self-pay | Admitting: Licensed Clinical Social Worker

## 2019-09-30 DIAGNOSIS — F411 Generalized anxiety disorder: Secondary | ICD-10-CM

## 2019-09-30 DIAGNOSIS — F4323 Adjustment disorder with mixed anxiety and depressed mood: Secondary | ICD-10-CM

## 2019-10-03 NOTE — BH Specialist Note (Signed)
Integrated Behavioral Health Follow Up Visit  MRN: [id number] Name: Marc Sandoval  Number of Integrated Behavioral Health Clinician visits: 3/6 Session Start time: 1:45 PM  Session End time: 2:45 PM Total time: 60  Type of Service: Integrated Behavioral Health- Individual Interpretor:No. Interpretor Name and Language: NA  SUBJECTIVE: Marc Sandoval is a 29 y.o. male accompanied by self Patient was referred by FNP Tiburcio Pea for depression and anxiety. 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  Reduce symptoms of: anxiety, depression and stress  2.  Increase knowledge and/or ability of: self-management skills  3.  Demonstrate ability to: Increase healthy adjustment to current life circumstances, Increase adequate support systems for patient/family and Decrease self-medicating behaviors  INTERVENTIONS: Interventions utilized:  Mindfulness or Relaxation Training and Brief CBT Standardized Assessments completed: GAD-7 and PHQ 2&9  ASSESSMENT: Patient currently experiencing psychosocial stressors that negatively impact pt's mental health.    Patient may benefit from continued brief therapy and medication management. Pt's symptoms of depression and anxiety has decreased evidenced by reduced PHQ9 and GAD7 scores from previous visit. He has implemented healthy coping skills on a routine  basis.   LCSW commended patient on his resilience and discussed additional self-care strategies to promote positive mood/image. Pt is interested in referral to Nutritionist/Dietician to address eating habits.   PLAN: 1. Follow up with behavioral health clinician on : Schedule follow up appt with LCSW for additional support with behavioral health and/or resource needs 2. Behavioral recommendations: Continue medication management and utilize strategies discussed 3. Referral(s): Integrated Behavioral Health Services (In Clinic) 4. "From scale of 1-10, how likely are you to follow plan?":   Bridgett Larsson, LCSW 10/03/2019 5:29 PM

## 2019-10-15 ENCOUNTER — Other Ambulatory Visit: Payer: Self-pay | Admitting: Pharmacist

## 2019-10-15 ENCOUNTER — Telehealth: Payer: Self-pay | Admitting: Pharmacist

## 2019-10-15 DIAGNOSIS — Z7252 High risk homosexual behavior: Secondary | ICD-10-CM

## 2019-10-15 MED ORDER — DESCOVY 200-25 MG PO TABS: 1.0000 | ORAL_TABLET | Freq: Every day | ORAL | 0 refills | Status: DC

## 2019-10-15 NOTE — Progress Notes (Signed)
Transferring a 30 day Rx of Descovy to [place] while patient is out of town

## 2019-10-15 NOTE — Telephone Encounter (Signed)
Patient currently sees me for PrEP. He is out of town and visiting family in [place]. He is in need of a refill. Will send a 30 day Rx of Descovy to a local [facility] near him.  He is on patient assistance, so Kathie Rhodes will call [facility] today to give them processing numbers. He sees me next month for follow up.

## 2019-10-17 NOTE — Telephone Encounter (Addendum)
Called the patient back today. I reached out to the store in [place] as well. They are trying to charge him $2000 for his medication. They are closed for lunch, I will reach out again via phone to make sure they processed his Advancing Access correctly. The store gave him one dose so he would not miss any.  Claim is adjusted successfully and patient will pick up from [facility] store today. Call center has been made aware of this change.

## 2019-10-21 ENCOUNTER — Ambulatory Visit (INDEPENDENT_AMBULATORY_CARE_PROVIDER_SITE_OTHER): Payer: Self-pay | Admitting: Licensed Clinical Social Worker

## 2019-10-21 ENCOUNTER — Other Ambulatory Visit: Payer: Self-pay

## 2019-10-21 DIAGNOSIS — F4323 Adjustment disorder with mixed anxiety and depressed mood: Secondary | ICD-10-CM

## 2019-10-26 ENCOUNTER — Encounter: Payer: Self-pay | Admitting: Family Medicine

## 2019-10-31 NOTE — Progress Notes (Signed)
Call placed to patient regarding scheduled IBH appointment. LCSW left message to return call at his earliest convenience.

## 2019-11-07 ENCOUNTER — Telehealth: Payer: Self-pay | Admitting: Licensed Clinical Social Worker

## 2019-11-07 NOTE — Telephone Encounter (Signed)
Follow up call placed to patient. Patient shared that he is currently out of town visiting with family after the recent passing of his grandmother. He identified multiple psychosocial stressors; however, reports that he feels safe and is still utilizing healthy coping skills identified from previous sessions.   LCSW provided encouragement and support. Pt was strongly encouraged to schedule follow up appointment when he returns to [facility]. Pt was appreciative and verbalized agreement. No additional concerns noted.

## 2019-11-11 ENCOUNTER — Ambulatory Visit: Payer: Medicaid Other | Admitting: Pharmacist

## 2019-11-13 ENCOUNTER — Telehealth: Payer: Self-pay | Admitting: Pharmacist

## 2019-11-13 NOTE — Telephone Encounter (Signed)
Patient called because he is due for labs this coming week for his HIV PrEP refills. He was in [place] last month, so we coordinated for him to get a refill at a pharmacy there in February.  He called again today because he is now in [place] and wanted [place] to set him up for labs there.  I told him that I have other [facility] that [facility] get a quick HIV test there. I told him that was the simplest option and it was really easy. He could just let me know that results and I would send him in refills to whatever pharmacy in [place] he wanted me to. He proceeded to say that he would rather get labs through [place]. I asked what he meant as he is not in town and he said that he wanted [place] to set him up somewhere to get them drawn.  He became angry and said someone told him that we would find him somewhere to go and set up everything. I again told him how [facility] and that I could help him with that and he became angry and started yelling and proceeded to hang up on me. He will get his PrEP at some other location from now on.

## 2019-11-14 ENCOUNTER — Telehealth: Payer: Self-pay

## 2019-11-14 NOTE — Telephone Encounter (Signed)
Request for Descovy from [facility] in [place].   [phone] Faxed form back with denied, patient will need labs prior to refill.   Laurell Josephs, RN

## 2019-11-17 ENCOUNTER — Ambulatory Visit: Payer: Medicaid Other | Admitting: Pharmacist

## 2019-11-17 NOTE — Telephone Encounter (Signed)
Thanks Tammy 

## 2020-01-16 ENCOUNTER — Other Ambulatory Visit: Payer: Self-pay

## 2020-01-16 ENCOUNTER — Encounter: Payer: Self-pay | Admitting: Emergency Medicine

## 2020-01-16 ENCOUNTER — Ambulatory Visit
Admission: EM | Admit: 2020-01-16 | Discharge: 2020-01-16 | Disposition: A | Discharge status: Home / Self Care | Payer: Medicaid Other | Attending: Physician Assistant | Admitting: Physician Assistant

## 2020-01-16 DIAGNOSIS — S80862A Insect bite (nonvenomous), left lower leg, initial encounter: Secondary | ICD-10-CM

## 2020-01-16 DIAGNOSIS — W57XXXA Bitten or stung by nonvenomous insect and other nonvenomous arthropods, initial encounter: Secondary | ICD-10-CM

## 2020-01-16 MED ORDER — SARNA 0.5-0.5 % EX LOTN: 1.0000 "application " | TOPICAL_LOTION | CUTANEOUS | 0 refills | Status: DC | PRN

## 2020-01-16 NOTE — ED Triage Notes (Signed)
Pt here after having bug bite x 2 last Saturday to left leg that is not improviing

## 2020-01-16 NOTE — Discharge Instructions (Signed)
No signs of infection. Warm compress to the area. Zyrtec for itching. If not improving can use SARNA lotion for itching as well. Monitor for spreading redness, warmth, fever, follow up for reevaluation needed.

## 2020-01-16 NOTE — ED Provider Notes (Signed)
[name]    CSN: [id number] Arrival date & time: 01/16/20  1510      History   Chief Complaint Chief Complaint  Patient presents with  . Insect Bite    HPI Marc Sandoval is a 29 y.o. male.   29 year old male comes in for insect bites 1 week ago. States had 2 bites, and still itching, and now having some swelling/pain to the area. States lateral left lower leg is diffusely itching. Denies spreading erythema, warmth. Denies fever, chills. Denies new bites. Have been applying alcohol to the area.      Past Medical History:  Diagnosis Date  . Anxiety   . Depression   . Hypertension   . Migraines     Patient Active Problem List   Diagnosis Date Noted  . Closed fracture of left ankle 10/13/2018    History reviewed. No pertinent surgical history.     Home Medications    Prior to Admission medications   Medication Sig Start Date End Date Taking? Authorizing Provider  camphor-menthol [facility]) lotion Apply 1 application topically as needed for itching. 01/16/20   Tasia Catchings, Jadakiss Barish V, PA-C  dicyclomine (BENTYL) 20 MG tablet Take 1 tablet (20 mg total) by mouth 2 (two) times daily. 06/20/19   Tasia Catchings, Deairra Halleck V, PA-C  emtricitabine-tenofovir AF (DESCOVY) 200-25 MG tablet Take 1 tablet by mouth daily. 10/15/19   Kuppelweiser, Cassie L, RPH-CPP  meloxicam (MOBIC) 7.5 MG tablet Take 1 tablet (7.5 mg total) by mouth daily. 09/18/19   Tasia Catchings, Chrisann Melaragno V, PA-C  ondansetron (ZOFRAN ODT) 4 MG disintegrating tablet Take 1 tablet (4 mg total) by mouth every 8 (eight) hours as needed for nausea or vomiting. 06/20/19   Tasia Catchings, Annaliz Aven V, PA-C  PARoxetine (PAXIL) 20 MG tablet Take 1 tablet (20 mg total) by mouth daily. 05/22/19   Charlott Rakes, MD    Family History Family History  Problem Relation Age of Onset  . Healthy Mother   . Healthy Daughter   . Stroke Neg Hx     Social History Social History   Tobacco Use  . Smoking status: Never Smoker  . Smokeless tobacco: Never Used  Substance Use  Topics  . Alcohol use: Yes    Comment: occ  . Drug use: Yes    Types: Marijuana     Allergies   Dayquil [pseudoephedrine-apap-dm], Doxylamine-phenylephrine-apap, Doxylamine-phenylephrine-apap, and Nyquil hbp cold & flu [dm-doxylamine-acetaminophen]   Review of Systems Review of Systems  Reason unable to perform ROS: See HPI as above.     Physical Exam Triage Vital Signs ED Triage Vitals [01/16/20 1522]  Enc Vitals Group     BP 139/90     Pulse Rate 64     Resp 18     Temp 98.1 F (36.7 C)     Temp Source Oral     SpO2 96 %     Weight      Height      Head Circumference      Peak Flow      Pain Score 3     Pain Loc      Pain Edu?      Excl. in [place]?    No data found.  Updated Vital Signs BP 139/90 (BP Location: Right Arm)   Pulse 64   Temp 98.1 F (36.7 C) (Oral)   Resp 18   SpO2 96%   Physical Exam Constitutional:      General: He is not in acute distress.  Appearance: Normal appearance. He is well-developed. He is not toxic-appearing or diaphoretic.  HENT:     Head: Normocephalic and atraumatic.  Eyes:     Conjunctiva/sclera: Conjunctivae normal.     Pupils: Pupils are equal, round, and reactive to light.  Pulmonary:     Effort: Pulmonary effort is normal. No respiratory distress.     Comments: Speaking in full sentences without difficulty Musculoskeletal:     Cervical back: Normal range of motion and neck supple.  Skin:    General: Skin is warm and dry.       Neurological:     Mental Status: He is alert and oriented to person, place, and time.      UC Treatments / Results  Labs (all labs ordered are listed, but only abnormal results are displayed) Labs Reviewed - No data to display  EKG   Radiology No results found.  Procedures Procedures (including critical care time)  Medications Ordered in UC Medications - No data to display  Initial Impression / Assessment and Plan / UC Course  I have reviewed the triage vital signs and  the nursing notes.  Pertinent labs & imaging results that were available during my care of the patient were reviewed by me and considered in my medical decision making (see chart for details).    No signs of infection. Will do warm compress to the nodule for now. Symptomatic treatment discussed. Return precautions given.  Final Clinical Impressions(s) / UC Diagnoses   Final diagnoses:  Insect bite of left lower leg, initial encounter    ED Prescriptions    Medication Sig Dispense Auth. Provider   camphor-menthol [facility]) lotion Apply 1 application topically as needed for itching. 222 mL Belinda Fisher, PA-C     PDMP not reviewed this encounter.   Belinda Fisher, PA-C 01/16/20 1627

## 2020-01-20 ENCOUNTER — Other Ambulatory Visit: Payer: Self-pay

## 2020-01-20 ENCOUNTER — Ambulatory Visit (INDEPENDENT_AMBULATORY_CARE_PROVIDER_SITE_OTHER): Payer: Self-pay | Admitting: Licensed Clinical Social Worker

## 2020-01-20 DIAGNOSIS — F4310 Post-traumatic stress disorder, unspecified: Secondary | ICD-10-CM

## 2020-01-20 DIAGNOSIS — F331 Major depressive disorder, recurrent, moderate: Secondary | ICD-10-CM

## 2020-01-20 DIAGNOSIS — F411 Generalized anxiety disorder: Secondary | ICD-10-CM

## 2020-01-21 ENCOUNTER — Ambulatory Visit (INDEPENDENT_AMBULATORY_CARE_PROVIDER_SITE_OTHER): Payer: Self-pay | Admitting: Pharmacist

## 2020-01-21 DIAGNOSIS — Z79899 Other long term (current) drug therapy: Secondary | ICD-10-CM

## 2020-01-21 DIAGNOSIS — Z113 Encounter for screening for infections with a predominantly sexual mode of transmission: Secondary | ICD-10-CM
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
## 2020-01-22 ENCOUNTER — Other Ambulatory Visit: Payer: Self-pay | Admitting: Pharmacist

## 2020-01-22 ENCOUNTER — Telehealth: Payer: Self-pay | Admitting: Pharmacist

## 2020-01-22 DIAGNOSIS — Z7252 High risk homosexual behavior: Secondary | ICD-10-CM

## 2020-01-22 LAB — CYTOLOGY, (ORAL, ANAL, URETHRAL) ANCILLARY ONLY
Chlamydia: NEGATIVE
Chlamydia: NEGATIVE
Comment: NEGATIVE
Comment: NEGATIVE
Comment: NORMAL
Comment: NORMAL
Neisseria Gonorrhea: NEGATIVE
Neisseria Gonorrhea: NEGATIVE

## 2020-01-22 LAB — BASIC METABOLIC PANEL
BUN: 13 mg/dL (ref 7–25)
CO2: 31 mmol/L (ref 20–32)
Calcium: 9.6 mg/dL (ref 8.6–10.3)
Chloride: 102 mmol/L (ref 98–110)
Creat: 1.07 mg/dL (ref 0.60–1.35)
Glucose, Bld: 82 mg/dL (ref 65–99)
Potassium: 4.1 mmol/L (ref 3.5–5.3)
Sodium: 138 mmol/L (ref 135–146)

## 2020-01-22 LAB — URINE CYTOLOGY ANCILLARY ONLY
Chlamydia: NEGATIVE
Comment: NEGATIVE
Comment: NORMAL
Neisseria Gonorrhea: NEGATIVE

## 2020-01-22 LAB — RPR: RPR Ser Ql: NONREACTIVE

## 2020-01-22 LAB — HIV ANTIBODY (ROUTINE TESTING W REFLEX): HIV 1&2 Ab, 4th Generation: NONREACTIVE

## 2020-01-22 MED ORDER — DESCOVY 200-25 MG PO TABS: 1.0000 | ORAL_TABLET | Freq: Every day | ORAL | 2 refills | Status: DC

## 2020-01-22 NOTE — Progress Notes (Signed)
Patient's HIV antibody is negative.  Will send in 3 more months of Descovy to [facility].  

## 2020-01-22 NOTE — Telephone Encounter (Cosign Needed)
Informed patient that HIV antibody and STD results are all negative. Will send Descovy x 3 months to [facility].   Fabio Neighbors, PharmD PGY1 Ambulatory Care Resident

## 2020-01-26 MED FILL — DESCOVY 200-25 MG TABS: 200-25 | 30 days supply | Qty: 30 | Fill #2

## 2020-01-27 ENCOUNTER — Encounter: Payer: Self-pay | Admitting: Family Medicine

## 2020-01-29 ENCOUNTER — Ambulatory Visit
Admission: EM | Admit: 2020-01-29 | Discharge: 2020-01-29 | Disposition: A | Discharge status: Home / Self Care | Payer: Medicaid Other | Attending: Emergency Medicine | Admitting: Emergency Medicine

## 2020-01-29 ENCOUNTER — Other Ambulatory Visit: Payer: Self-pay

## 2020-01-29 ENCOUNTER — Emergency Department (HOSPITAL_COMMUNITY)
Admission: EM | Admit: 2020-01-29 | Discharge: 2020-01-29 | Disposition: A | Discharge status: Home / Self Care | Payer: Medicaid Other | Attending: Emergency Medicine | Admitting: Emergency Medicine

## 2020-01-29 ENCOUNTER — Emergency Department (HOSPITAL_COMMUNITY): Payer: Medicaid Other

## 2020-01-29 DIAGNOSIS — Z5321 Procedure and treatment not carried out due to patient leaving prior to being seen by health care provider: Secondary | ICD-10-CM | POA: Insufficient documentation

## 2020-01-29 DIAGNOSIS — R079 Chest pain, unspecified: Secondary | ICD-10-CM | POA: Insufficient documentation

## 2020-01-29 DIAGNOSIS — R9431 Abnormal electrocardiogram [ECG] [EKG]: Secondary | ICD-10-CM

## 2020-01-29 LAB — BASIC METABOLIC PANEL
Anion gap: 8 (ref 5–15)
BUN: 13 mg/dL (ref 6–20)
CO2: 27 mmol/L (ref 22–32)
Calcium: 9.1 mg/dL (ref 8.9–10.3)
Chloride: 104 mmol/L (ref 98–111)
Creatinine, Ser: 1.04 mg/dL (ref 0.61–1.24)
GFR calc Af Amer: >60 mL/min (ref >60)
GFR calc non Af Amer: >60 mL/min (ref >60)
Glucose, Bld: 101 mg/dL — ABNORMAL HIGH (ref 70–99)
Potassium: 4.3 mmol/L (ref 3.5–5.1)
Sodium: 139 mmol/L (ref 135–145)

## 2020-01-29 LAB — CBC
HCT: 41.1 % (ref 39.0–52.0)
Hemoglobin: 13.7 g/dL (ref 13.0–17.0)
MCH: 30.4 pg (ref 26.0–34.0)
MCHC: 33.3 g/dL (ref 30.0–36.0)
MCV: 91.1 fL (ref 80.0–100.0)
Platelets: 231 10*3/uL (ref 150–400)
RBC: 4.51 MIL/uL (ref 4.22–5.81)
RDW: 13.3 % (ref 11.5–15.5)
WBC: 7.3 10*3/uL (ref 4.0–10.5)
nRBC: 0 % (ref 0.0–0.2)

## 2020-01-29 LAB — TROPONIN I (HIGH SENSITIVITY): Troponin I (High Sensitivity): 3 ng/L (ref <18)

## 2020-01-29 MED ORDER — SODIUM CHLORIDE 0.9% FLUSH: 3.0000 mL | Freq: Once | INTRAVENOUS | Status: DC
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
## 2020-01-29 NOTE — ED Notes (Signed)
Pt stated that he could not stay because of his anxiety.  He was encouraged to return if needed.

## 2020-01-29 NOTE — ED Notes (Signed)
Patient is being discharged from the Urgent Care Center and sent to the Emergency Department via POV. Per [place] Hall-Potvin, [place], patient is stable but in need of higher level of care due to chest pain and abnormal EKG. Patient is aware and verbalizes understanding of plan of care.  Vitals:   01/29/20 1410  BP: 140/88  Pulse: 74  Resp: 16  Temp: 98 F (36.7 C)  SpO2: 98%

## 2020-01-31 ENCOUNTER — Other Ambulatory Visit: Payer: Self-pay

## 2020-01-31 ENCOUNTER — Ambulatory Visit (INDEPENDENT_AMBULATORY_CARE_PROVIDER_SITE_OTHER): Payer: Self-pay

## 2020-01-31 ENCOUNTER — Ambulatory Visit
Admission: EM | Admit: 2020-01-31 | Discharge: 2020-01-31 | Disposition: A | Discharge status: Home / Self Care | Payer: Self-pay | Attending: Physician Assistant | Admitting: Physician Assistant

## 2020-01-31 DIAGNOSIS — M25532 Pain in left wrist: Secondary | ICD-10-CM

## 2020-01-31 DIAGNOSIS — M791 Myalgia, unspecified site: Secondary | ICD-10-CM

## 2020-01-31 DIAGNOSIS — T07XXXA Unspecified multiple injuries, initial encounter: Secondary | ICD-10-CM

## 2020-01-31 MED ORDER — DICLOFENAC SODIUM 50 MG PO TBEC
50.0000 mg | DELAYED_RELEASE_TABLET | Freq: Two times a day (BID) | ORAL | 0 refills | Status: DC
Start: 2020-01-31 — End: 2020-08-19

## 2020-01-31 MED ORDER — MUPIROCIN 2 % EX OINT: 1.0000 "application " | TOPICAL_OINTMENT | Freq: Two times a day (BID) | CUTANEOUS | 0 refills | Status: DC
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
## 2020-01-31 NOTE — Discharge Instructions (Signed)
Xray negative for fracture or dislocation. Start diclofenac. Do not take ibuprofen (motrin/advil)/ naproxen (aleve) while on diclofenac. Dress wound with bactroban for first 3-4 days. Keep wound clean and dry. You can clean gently with soap and water. Do not soak area in water. Monitor for spreading redness, increased warmth, increased swelling, fever, follow up for reevaluation needed.

## 2020-02-03 ENCOUNTER — Telehealth: Payer: Self-pay

## 2020-02-03 ENCOUNTER — Ambulatory Visit: Payer: Medicaid Other

## 2020-02-03 NOTE — Telephone Encounter (Signed)
Patient called office today regarding appointment he had with Mel Almond, counselor. Patient is uninsured prep patient, and is unable to make it to today's appointment.  Spoke with Marylu Lund who states she is only seeing HIV patients who are getting financial assistance with clinic; other patients will need to go to [facility] .  Attempted to explain this to patient, but was cut off. Patient verbalized he was agitated and not interested in calling another office for counseling. Ended call before providing additional information for family services Lorenso Courier, [place]

## 2020-02-04 ENCOUNTER — Telehealth: Payer: Self-pay

## 2020-02-04 NOTE — Patient Instructions (Signed)

## 2020-02-04 NOTE — Telephone Encounter (Signed)
Called patient to do their pre-visit COVID screening.  Call went to voicemail. Unable to do prescreening.  

## 2020-02-05 ENCOUNTER — Other Ambulatory Visit: Payer: Self-pay

## 2020-02-05 ENCOUNTER — Encounter: Payer: Self-pay | Admitting: Internal Medicine

## 2020-02-05 ENCOUNTER — Ambulatory Visit (INDEPENDENT_AMBULATORY_CARE_PROVIDER_SITE_OTHER): Payer: Self-pay | Admitting: Internal Medicine

## 2020-02-05 VITALS — BP 123/73 | HR 60 | Temp 97.3°F | Resp 17 | Ht 75.0 in | Wt 158.0 lb

## 2020-02-05 DIAGNOSIS — Z0000 Encounter for general adult medical examination without abnormal findings: Secondary | ICD-10-CM

## 2020-02-05 DIAGNOSIS — F32A Depression, unspecified: Secondary | ICD-10-CM

## 2020-02-05 DIAGNOSIS — F329 Major depressive disorder, single episode, unspecified: Secondary | ICD-10-CM

## 2020-02-05 DIAGNOSIS — Z9189 Other specified personal risk factors, not elsewhere classified: Secondary | ICD-10-CM
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
## 2020-02-06 ENCOUNTER — Other Ambulatory Visit: Payer: Self-pay | Admitting: Internal Medicine

## 2020-02-06 DIAGNOSIS — E559 Vitamin D deficiency, unspecified: Secondary | ICD-10-CM

## 2020-02-06 LAB — VITAMIN D 25 HYDROXY (VIT D DEFICIENCY, FRACTURES): Vit D, 25-Hydroxy: 13.2 ng/mL — ABNORMAL LOW (ref 30.0–100.0)

## 2020-02-06 LAB — VITAMIN B12: Vitamin B-12: 444 pg/mL (ref 232–1245)

## 2020-02-06 MED ORDER — VITAMIN D (ERGOCALCIFEROL) 1.25 MG (50000 UNIT) PO CAPS: 50000.0000 [IU] | ORAL_CAPSULE | ORAL | 0 refills | Status: DC

## 2020-02-16 NOTE — BH Specialist Note (Signed)
Integrated Behavioral Health Follow Up Visit  MRN: [id number] Name: Marc Sandoval  Number of Integrated Behavioral Health Clinician visits: 4/6 Session Start time: 2:45 PM  Session End time: 3:30 PM Total time: 45   Type of Service: Integrated Behavioral Health- Individual Interpretor:No. Interpretor Name and Language: NA  SUBJECTIVE: Loyal Holzheimer is a 29 y.o. male accompanied by self Patient was referred by Dr. Earlene Plater for depression and anxiety. Patient reports the following symptoms/concerns: Patient reports difficulty managing depression and anxiety symptoms triggered by ongoing psychosocial stressors.  Patient is currently homeless, has strained relationships with family, and has history of unhealthy relationship relationships that contribute to symptoms.  Duration of problem: Ongoing; Severity of problem: severe  OBJECTIVE: Mood: Anxious and Depressed and Affect: Appropriate Risk of harm to self or others: No plan to harm self or others Pt scored positive on phq9; however, denied suicidal ideations. Protective factors identified, safety plan discussed, and crisis intervention resources provided  LIFE CONTEXT: Family and Social: Pt receives very minimal support. He is currently residing with a relative temporarily School/Work: Pt is not employed Self-Care: Pt reports increase in alcohol use. He is open to participating in therapy and medication management Life Changes: Pt reports ongoing psychosocial stressors  GOALS ADDRESSED: Patient will: 1.  Reduce symptoms of: agitation, anxiety, depression and stress  2.  Increase knowledge and/or ability of: self-management skills  3.  Demonstrate ability to: Increase healthy adjustment to current life circumstances, Increase adequate support systems for patient/family, Increase motivation to adhere to plan of care and Decrease self-medicating behaviors  INTERVENTIONS: Interventions utilized:  Motivational Interviewing Standardized  Assessments completed: GAD-7 and PHQ 2&9 with C-SSRS  ASSESSMENT: Patient currently experiencing difficulty managing depression and anxiety symptoms triggered by ongoing psychosocial stressors. Pt scored positive on phq9; however, denied suicidal ideations. Protective factors identified, safety plan discussed, and crisis intervention resources provided.   Patient may benefit from initiation of psychotherapy and medication management in the community.  LCSW discussed various programs that may assist patient with substance use and management of mental health conditions.  Patient verbalized consent for LCSW to complete RSVP referral to assist with housing.  Patient was strongly encouraged to initiate services with [facility] [place] patient verbalized understanding.  PLAN: 1. Follow up with behavioral health clinician on : Schedule follow-up appointment with LCSW 2. Behavioral recommendations: Utilize strategies discussed and resources provided 3. Referral(s): Integrated Art gallery manager (In Clinic), [facility] (LME/Outside Clinic) and [employer]:  Housing 4. "From scale of 1-10, how likely are you to follow plan?":   Bridgett Larsson, LCSW 02/16/2020 8:06 AM

## 2020-02-17 MED FILL — DESCOVY 200-25 MG TABS: 200-25 | 30 days supply | Qty: 30 | Fill #0

## 2020-02-17 NOTE — Progress Notes (Signed)
Patient notified of results/recommendations.

## 2020-02-20 NOTE — Telephone Encounter (Signed)
Completed FL2 emailed to [employer] with [place]. Confirmation of receipt received.

## 2020-03-18 MED FILL — DESCOVY 200-25 MG TABS: 200-25 | 30 days supply | Qty: 30 | Fill #1

## 2020-04-05 ENCOUNTER — Telehealth: Payer: Self-pay | Admitting: General Practice

## 2020-04-05 NOTE — Telephone Encounter (Signed)
Please advise.  Copied from CRM [id number]. Topic: General - Other >> Apr 05, 2020  1:56 PM Tamela Oddi wrote:  Reason for CRM: Patient called to speak with [facility] regarding some counseling questions.  Please call patient back to discuss at [phone]

## 2020-04-06 ENCOUNTER — Ambulatory Visit (INDEPENDENT_AMBULATORY_CARE_PROVIDER_SITE_OTHER): Payer: Self-pay | Admitting: Licensed Clinical Social Worker

## 2020-04-06 ENCOUNTER — Other Ambulatory Visit: Payer: Self-pay

## 2020-04-06 DIAGNOSIS — F322 Major depressive disorder, single episode, severe without psychotic features: Secondary | ICD-10-CM

## 2020-04-06 DIAGNOSIS — F411 Generalized anxiety disorder: Secondary | ICD-10-CM

## 2020-04-06 NOTE — BH Specialist Note (Signed)
Integrated Behavioral Health Initial Visit  MRN: [id number] Name: Marc Sandoval  Number of Integrated Behavioral Health Clinician visits:: 1/6 Session Start time: 1:45 PM  Session End time: 2:15 PM Total time: 30  Type of Service: Integrated Behavioral Health- Individual Interpretor:No. Interpretor Name and Language: NA   SUBJECTIVE: Marc Sandoval is a 29 y.o. male accompanied by self Patient was referred by Dr. Earlene Plater for depression and anxiety. Patient reports the following symptoms/concerns: Pt reports increase in anxiety and depression triggered by homelessness and recent denial of services through [facility] RSVP/TCLI Program Duration of problem: Ongoing; Severity of problem: severe  OBJECTIVE: Mood: Anxious and Depressed and Affect: Depressed Risk of harm to self or others: No plan to harm self or others  LIFE CONTEXT: Family and Social: Pt receives no support in the community School/Work: Patient has no income and is in uninsured Self-Care: Patient reports increase in substance use (alcohol) cope with psychosocial stressors Life Changes: Patient reports difficulty managing mental health triggered by psychosocial stressors  GOALS ADDRESSED: Patient will: 1. Reduce symptoms of: anxiety, depression and stress 2. Increase knowledge and/or ability of: self-management skills  3. Demonstrate ability to: Increase adequate support systems for patient/family and Decrease self-medicating behaviors  INTERVENTIONS: Interventions utilized: Solution-Focused Strategies, Supportive Counseling and Link to [employer]  Standardized Assessments completed: Patient declined screening  ASSESSMENT: Patient currently experiencing increase in depression anxiety symptoms triggered by psychosocial stressors.  Patient reports chronic homelessness resulting in increasing substance use.   Patient may benefit from medication to community resources, medication management, and therapy.   Patient was recently denied supportive services through [facility] and discontinued therapy at [facility] [place].  Support and encouragement provided.  Strategies to promote hope and gratitude discussed.  Patient verbalized consent for LCSW to complete referral for case management services and housing options discussed, including, local shelters.  Patient is interested in transitional housing sharing that it can be outside of [facility]    PLAN: 1. Follow up with behavioral health clinician on : Schedule follow-up appointment with LCSW 2. Behavioral recommendations: Utilize strategies and resources provided 3. Referral(s): Integrated Art gallery manager (In Clinic) and [employer]:  Housing 4. "From scale of 1-10, how likely are you to follow plan?":   Bridgett Larsson, LCSW 04/10/2020 4:51 PM

## 2020-04-13 ENCOUNTER — Other Ambulatory Visit: Payer: Self-pay

## 2020-04-13 ENCOUNTER — Ambulatory Visit
Admission: EM | Admit: 2020-04-13 | Discharge: 2020-04-13 | Disposition: A | Discharge status: Home / Self Care | Payer: Medicaid Other | Attending: Emergency Medicine | Admitting: Emergency Medicine

## 2020-04-13 ENCOUNTER — Encounter: Payer: Self-pay | Admitting: Emergency Medicine

## 2020-04-13 DIAGNOSIS — R112 Nausea with vomiting, unspecified: Secondary | ICD-10-CM

## 2020-04-13 DIAGNOSIS — T781XXA Other adverse food reactions, not elsewhere classified, initial encounter: Secondary | ICD-10-CM

## 2020-04-13 MED ORDER — ONDANSETRON 4 MG PO TBDP
4.0000 mg | ORAL_TABLET | Freq: Once | ORAL | Status: AC
  Administered 2020-04-13: 4 mg via ORAL

## 2020-04-13 NOTE — ED Triage Notes (Signed)
Pt here N/V/D starting yesterday

## 2020-04-13 NOTE — ED Provider Notes (Signed)
[name]    CSN: [id number] Arrival date & time: 04/13/20  1520      History   Chief Complaint Chief Complaint  Patient presents with  . Emesis  . Diarrhea    HPI Marc Sandoval is a 29 y.o. male presenting for nausea, vomiting with abdominal pain since yesterday.  States he ate undercooked wings from [employer].  No blood in stool, or bile in emesis.  Emesis x2 total.  Able to keep down fluids.  No fever, chest pain or difficulty breathing.   Past Medical History:  Diagnosis Date  . Anxiety   . Depression   . Hypertension   . Migraines     Patient Active Problem List   Diagnosis Date Noted  . Vitamin D deficiency 02/06/2020  . Closed fracture of left ankle 10/13/2018    History reviewed. No pertinent surgical history.     Home Medications    Prior to Admission medications   Medication Sig Start Date End Date Taking? Authorizing Provider  diclofenac (VOLTAREN) 50 MG EC tablet Take 1 tablet (50 mg total) by mouth 2 (two) times daily. 01/31/20   Cathie Hoops, Amy V, PA-C  emtricitabine-tenofovir AF (DESCOVY) 200-25 MG tablet Take 1 tablet by mouth daily. 01/22/20   Kuppelweiser, Cassie L, RPH-CPP  mupirocin ointment (BACTROBAN) 2 % Apply 1 application topically 2 (two) times daily. 01/31/20   Cathie Hoops, Amy V, PA-C  Vitamin D, Ergocalciferol, (DRISDOL) 1.25 MG (50000 UNIT) CAPS capsule Take 1 capsule (50,000 Units total) by mouth every 7 (seven) days. 02/06/20   Arvilla Market, DO  dicyclomine (BENTYL) 20 MG tablet Take 1 tablet (20 mg total) by mouth 2 (two) times daily. 06/20/19 01/31/20  Belinda Fisher, PA-C    Family History Family History  Problem Relation Age of Onset  . Healthy Mother   . Healthy Daughter   . Stroke Neg Hx     Social History Social History   Tobacco Use  . Smoking status: Never Smoker  . Smokeless tobacco: Never Used  Substance Use Topics  . Alcohol use: Yes    Comment: occ  . Drug use: Yes    Types: Marijuana     Allergies     Dayquil [pseudoephedrine-apap-dm], Doxylamine-phenylephrine-apap, Doxylamine-phenylephrine-apap, Nyquil hbp cold & flu [dm-doxylamine-acetaminophen], and Vancomycin   Review of Systems As per HPI   Physical Exam Triage Vital Signs ED Triage Vitals [04/13/20 1531]  Enc Vitals Group     BP 132/83     Pulse Rate 63     Resp 18     Temp 98.7 F (37.1 C)     Temp Source Oral     SpO2 96 %     Weight      Height      Head Circumference      Peak Flow      Pain Score 5     Pain Loc      Pain Edu?      Excl. in [place]?    No data found.  Updated Vital Signs BP 132/83 (BP Location: Right Arm)   Pulse 63   Temp 98.7 F (37.1 C) (Oral)   Resp 18   SpO2 96%   Visual Acuity Right Eye Distance:   Left Eye Distance:   Bilateral Distance:    Right Eye Near:   Left Eye Near:    Bilateral Near:     Physical Exam Constitutional:      General: He is not in acute  distress.    Appearance: He is not ill-appearing.  HENT:     Head: Normocephalic and atraumatic.     Mouth/Throat:     Mouth: Mucous membranes are moist.     Pharynx: Oropharynx is clear.  Eyes:     General: No scleral icterus.    Pupils: Pupils are equal, round, and reactive to light.  Cardiovascular:     Rate and Rhythm: Normal rate and regular rhythm.  Pulmonary:     Effort: Pulmonary effort is normal. No respiratory distress.     Breath sounds: No wheezing.  Abdominal:     General: Abdomen is flat. Bowel sounds are normal. There is no distension.     Palpations: Abdomen is soft.     Tenderness: There is no guarding or rebound.     Comments: LUQ TTP, mild  Skin:    Coloration: Skin is not jaundiced or pale.  Neurological:     Mental Status: He is alert and oriented to person, place, and time.      UC Treatments / Results  Labs (all labs ordered are listed, but only abnormal results are displayed) Labs Reviewed - No data to display  EKG   Radiology No results found.  Procedures Procedures  (including critical care time)  Medications Ordered in UC Medications  ondansetron (ZOFRAN-ODT) disintegrating tablet 4 mg (4 mg Oral Given 04/13/20 1553)    Initial Impression / Assessment and Plan / UC Course  I have reviewed the triage vital signs and the nursing notes.  Pertinent labs & imaging results that were available during my care of the patient were reviewed by me and considered in my medical decision making (see chart for details).     Patient febrile, nontoxic, able to tolerate p.o. intake.  No signs of dehydration at this time.  Will treat supportively as outlined below.  Provided at patient's request.  Return precautions discussed, pt verbalized understanding and is agreeable to plan. Final Clinical Impressions(s) / UC Diagnoses   Final diagnoses:  Non-intractable vomiting with nausea, unspecified vomiting type  Adverse food reaction, initial encounter     Discharge Instructions     Zofran for nausea. Hydrate with water. Return for worsening nausea, vomiting, abdominal pain, or you develop fever, chest pain, difficulty breathing.    ED Prescriptions    None     PDMP not reviewed this encounter.   Hall[place]Potvin, [place], [place] 04/13/20 1626

## 2020-04-13 NOTE — Discharge Instructions (Addendum)
Zofran for nausea. Hydrate with water. Return for worsening nausea, vomiting, abdominal pain, or you develop fever, chest pain, difficulty breathing.

## 2020-04-14 NOTE — Telephone Encounter (Signed)
Pt called in and would like to speak [facility] number [phone]

## 2020-04-15 MED FILL — DESCOVY 200-25 MG TABS: 200-25 | 30 days supply | Qty: 30 | Fill #2

## 2020-04-15 NOTE — Telephone Encounter (Signed)
Pt states this is an urgent matter and he needs a call back asap.

## 2020-04-16 ENCOUNTER — Other Ambulatory Visit: Payer: Self-pay

## 2020-04-16 ENCOUNTER — Emergency Department (HOSPITAL_COMMUNITY)
Admission: EM | Admit: 2020-04-16 | Discharge: 2020-04-16 | Disposition: A | Discharge status: Home / Self Care | Payer: Medicaid Other | Attending: Emergency Medicine | Admitting: Emergency Medicine

## 2020-04-16 ENCOUNTER — Ambulatory Visit
Admission: EM | Admit: 2020-04-16 | Discharge: 2020-04-16 | Disposition: A | Discharge status: Home / Self Care | Payer: Self-pay | Attending: Emergency Medicine | Admitting: Emergency Medicine

## 2020-04-16 DIAGNOSIS — R112 Nausea with vomiting, unspecified: Secondary | ICD-10-CM | POA: Insufficient documentation

## 2020-04-16 DIAGNOSIS — Z5321 Procedure and treatment not carried out due to patient leaving prior to being seen by health care provider: Secondary | ICD-10-CM | POA: Insufficient documentation

## 2020-04-16 DIAGNOSIS — R1084 Generalized abdominal pain: Secondary | ICD-10-CM | POA: Insufficient documentation

## 2020-04-16 LAB — POCT RAPID STREP A (OFFICE): Rapid Strep A Screen: NEGATIVE

## 2020-04-16 NOTE — ED Notes (Signed)
Pt called for triage multiple times, did not answer.

## 2020-04-16 NOTE — Discharge Instructions (Addendum)
Important to follow-up with gastroenterology for further evaluation management of your nausea. Blood work pending: Please Designer, industrial/product for results. Go to ER for worsening nausea, vomiting, lightheadedness, cramping, chest pain, palpitations, extreme weakness.

## 2020-04-16 NOTE — ED Notes (Signed)
Pt became aggressive with staff and left.

## 2020-04-16 NOTE — Telephone Encounter (Signed)
Follow up call placed to patient. Patient reports that he is experiencing ongoing stressors regarding obtaining safe housing. Validation and encouragement provided. LCSW discussed with patient resources that provide local support; however, is still looking for an appropriate transitional housing program. Patient was informed that a referral to [facility] for a [facility] has been completed and will reach out to him in approximately seven calendar days. Pt has been utilizing reading to promote insight and relaxation. Shared that he has decreased alcohol and marijuana use. No additional concerns noted and LCSW will follow up.

## 2020-04-16 NOTE — ED Triage Notes (Signed)
Pt with c/o continued n/v/d since 8/9.

## 2020-04-16 NOTE — ED Provider Notes (Signed)
[name]    CSN: [id number] Arrival date & time: 04/16/20  1311      History   Chief Complaint Chief Complaint  Patient presents with  . Emesis    HPI Marc Sandoval is a 29 y.o. male with history of hypertension, anxiety, depression, migraines presenting for persistent abdominal pain with associated nausea and vomiting.  Previously seen by me on 04/13/2020: Please see notes.  Since previous evaluation, patient states Zofran has not been helpful.  Has continued to have nausea with emesis and abdominal pain.  No fever, hematochezia, melena, change in urination.  States he is able to keep a little down by mouth: Went to ER, the left due to wait time.  Denies chest pain, shortness of breath.  Past Medical History:  Diagnosis Date  . Anxiety   . Depression   . Hypertension   . Migraines     Patient Active Problem List   Diagnosis Date Noted  . Vitamin D deficiency 02/06/2020  . Closed fracture of left ankle 10/13/2018    History reviewed. No pertinent surgical history.     Home Medications    Prior to Admission medications   Medication Sig Start Date End Date Taking? Authorizing Provider  diclofenac (VOLTAREN) 50 MG EC tablet Take 1 tablet (50 mg total) by mouth 2 (two) times daily. 01/31/20   Cathie Hoops, Amy V, PA-C  emtricitabine-tenofovir AF (DESCOVY) 200-25 MG tablet Take 1 tablet by mouth daily. 01/22/20   Kuppelweiser, Cassie L, RPH-CPP  mupirocin ointment (BACTROBAN) 2 % Apply 1 application topically 2 (two) times daily. 01/31/20   Cathie Hoops, Amy V, PA-C  Vitamin D, Ergocalciferol, (DRISDOL) 1.25 MG (50000 UNIT) CAPS capsule Take 1 capsule (50,000 Units total) by mouth every 7 (seven) days. 02/06/20   Arvilla Market, DO  dicyclomine (BENTYL) 20 MG tablet Take 1 tablet (20 mg total) by mouth 2 (two) times daily. 06/20/19 01/31/20  Belinda Fisher, PA-C    Family History Family History  Problem Relation Age of Onset  . Healthy Mother   . Healthy Daughter   .  Stroke Neg Hx     Social History Social History   Tobacco Use  . Smoking status: Never Smoker  . Smokeless tobacco: Never Used  Substance Use Topics  . Alcohol use: Yes    Comment: occ  . Drug use: Yes    Types: Marijuana     Allergies   Dayquil [pseudoephedrine-apap-dm], Doxylamine-phenylephrine-apap, Doxylamine-phenylephrine-apap, Nyquil hbp cold & flu [dm-doxylamine-acetaminophen], and Vancomycin   Review of Systems As per HPI   Physical Exam Triage Vital Signs ED Triage Vitals  Enc Vitals Group     BP      Pulse      Resp      Temp      Temp src      SpO2      Weight      Height      Head Circumference      Peak Flow      Pain Score      Pain Loc      Pain Edu?      Excl. in GC?    No data found.  Updated Vital Signs BP (!) 142/82 (BP Location: Left Arm)   Pulse 65   Temp 98.9 F (37.2 C) (Oral)   Resp 16   SpO2 98%   Visual Acuity Right Eye Distance:   Left Eye Distance:   Bilateral Distance:    Right  Eye Near:   Left Eye Near:    Bilateral Near:     Physical Exam Constitutional:      General: He is not in acute distress.    Appearance: He is not toxic-appearing.  HENT:     Head: Normocephalic and atraumatic.     Mouth/Throat:     Pharynx: Oropharynx is clear. Posterior oropharyngeal erythema present.     Comments: 2+ tonsillar hypertrophy with scant exudate vs stones Eyes:     General: No scleral icterus.    Conjunctiva/sclera: Conjunctivae normal.     Pupils: Pupils are equal, round, and reactive to light.  Cardiovascular:     Rate and Rhythm: Normal rate and regular rhythm.  Pulmonary:     Effort: Pulmonary effort is normal. No respiratory distress.     Breath sounds: No wheezing or rales.  Musculoskeletal:     Cervical back: No tenderness.  Lymphadenopathy:     Cervical: No cervical adenopathy.  Skin:    Coloration: Skin is not jaundiced or pale.  Neurological:     Mental Status: He is alert and oriented to person,  place, and time.      UC Treatments / Results  Labs (all labs ordered are listed, but only abnormal results are displayed) Labs Reviewed  POCT RAPID STREP A (OFFICE) - Normal  CULTURE, GROUP A STREP [facility])    EKG   Radiology No results found.  Procedures Procedures (including critical care time)  Medications Ordered in UC Medications - No data to display  Initial Impression / Assessment and Plan / UC Course  I have reviewed the triage vital signs and the nursing notes.  Pertinent labs & imaging results that were available during my care of the patient were reviewed by me and considered in my medical decision making (see chart for details).     Patient febrile, nontoxic in office today.  Patient does have erythematous throat now: Strep obtained-negative, culture pending.  Offered laboratory work-up for abdominal pain: Patient initially agreeable, though then states he does not want to do this and will go to the ER instead for further evaluaion: labs discontinued.  Return precautions discussed, pt verbalized understanding and is agreeable to plan. Final Clinical Impressions(s) / UC Diagnoses   Final diagnoses:  Intractable vomiting with nausea, unspecified vomiting type  Abdominal pain, generalized     Discharge Instructions     Important to follow-up with gastroenterology for further evaluation management of your nausea. Blood work pending: Please Designer, industrial/product for results. Go to ER for worsening nausea, vomiting, lightheadedness, cramping, chest pain, palpitations, extreme weakness.    ED Prescriptions    None     PDMP not reviewed this encounter.   Hall-Potvin, [place], [place] 04/16/20 1556

## 2020-04-18 LAB — CULTURE, GROUP A STREP (THRC)

## 2020-04-19 ENCOUNTER — Ambulatory Visit (INDEPENDENT_AMBULATORY_CARE_PROVIDER_SITE_OTHER): Payer: Self-pay | Admitting: Pharmacist

## 2020-04-19 ENCOUNTER — Other Ambulatory Visit: Payer: Self-pay

## 2020-04-19 DIAGNOSIS — Z7252 High risk homosexual behavior: Secondary | ICD-10-CM

## 2020-04-19 DIAGNOSIS — Z79899 Other long term (current) drug therapy: Secondary | ICD-10-CM

## 2020-04-19 DIAGNOSIS — Z113 Encounter for screening for infections with a predominantly sexual mode of transmission: Secondary | ICD-10-CM

## 2020-04-19 NOTE — Progress Notes (Signed)
Date:  04/19/2020   HPI: Marc Sandoval is a 29 y.o. male who presents to the [facility] pharmacy clinic for 3 month PrEP follow-up.  Insured   []    Uninsured  [x]    Patient Active Problem List   Diagnosis Date Noted   Vitamin D deficiency 02/06/2020   Closed fracture of left ankle 10/13/2018    Patient's Medications  New Prescriptions   No medications on file  Previous Medications   DICLOFENAC (VOLTAREN) 50 MG EC TABLET    Take 1 tablet (50 mg total) by mouth 2 (two) times daily.   EMTRICITABINE-TENOFOVIR AF (DESCOVY) 200-25 MG TABLET    Take 1 tablet by mouth daily.   MUPIROCIN OINTMENT (BACTROBAN) 2 %    Apply 1 application topically 2 (two) times daily.   VITAMIN D, ERGOCALCIFEROL, (DRISDOL) 1.25 MG (50000 UNIT) CAPS CAPSULE    Take 1 capsule (50,000 Units total) by mouth every 7 (seven) days.  Modified Medications   No medications on file  Discontinued Medications   No medications on file    Allergies: Allergies  Allergen Reactions   Dayquil [Pseudoephedrine-Apap-Dm] Hives   Doxylamine-Phenylephrine-Apap Hives   Doxylamine-Phenylephrine-Apap Hives   Nyquil Hbp Cold & Flu [Dm-Doxylamine-Acetaminophen] Hives   Vancomycin     Past Medical History: Past Medical History:  Diagnosis Date   Anxiety    Depression    Hypertension    Migraines     Social History: Social History   Socioeconomic History   Marital status: Single    Spouse name: Not on file   Number of children: Not on file   Years of education: Not on file   Highest education level: Not on file  Occupational History   Not on file  Tobacco Use   Smoking status: Never Smoker   Smokeless tobacco: Never Used  Substance and Sexual Activity   Alcohol use: Yes    Comment: occ   Drug use: Yes    Types: Marijuana   Sexual activity: Not on file  Other Topics Concern   Not on file  Social History Narrative   ** Merged History Encounter **       Social Determinants of Health    Financial Resource Strain:    Difficulty of Paying Living Expenses:   Food Insecurity:    Worried About 04/07/2020 in the Last Year:    12/12/2018 in the Last Year:   Transportation Needs:    Programme researcher, broadcasting/film/video (Medical):    Lack of Transportation (Non-Medical):   Physical Activity:    Days of Exercise per Week:    Minutes of Exercise per Session:   Stress:    Feeling of Stress :   Social Connections:    Frequency of Communication with Friends and Family:    Frequency of Social Gatherings with Friends and Family:    Attends Religious Services:    Active Member of Clubs or Organizations:    Attends Barista Meetings:    Marital Status:     CHL HIV PREP FLOWSHEET RESULTS 01/21/2020 07/15/2019  Insurance Status Uninsured Uninsured  Gender at birth Male Male  Gender identity cis-Male cis-Male  Risk for HIV - In sexual relationship with HIV+ partner;Condomless vaginal or anal intercourse;Hx of STI  Sex Partners Both men and women Both men and women  # sex partners past 3-6 mos 1-3 1-3  Sex activity preferences Insertive;Oral Insertive;Oral  Condom use Yes No  % condom use 100 -  Treated for STI? No No  HIV symptoms? - N/A  PrEP Eligibility - Substantial risk for HIV    Labs:  SCr: Lab Results  Component Value Date   CREATININE 1.04 01/29/2020   CREATININE 1.07 01/21/2020   CREATININE 0.97 07/15/2019   CREATININE 1.03 05/22/2019   CREATININE 0.93 10/09/2018   HIV Lab Results  Component Value Date   HIV NON-REACTIVE 01/21/2020   HIV NON-REACTIVE 08/13/2019   HIV NON-REACTIVE 07/15/2019   HIV Non Reactive 07/01/2019   HIV Non Reactive 10/09/2018   Hepatitis B Lab Results  Component Value Date   HEPBSAB REACTIVE (A) 07/15/2019   HEPBSAG NON-REACTIVE 07/15/2019   Hepatitis C Lab Results  Component Value Date   HEPCAB REACTIVE (A) 07/15/2019   HCVRNAPCRQN <15 NOT DETECTED 07/15/2019   Hepatitis A Lab Results   Component Value Date   HAV REACTIVE (A) 07/15/2019   RPR and STI Lab Results  Component Value Date   LABRPR NON-REACTIVE 01/21/2020   LABRPR Non Reactive 07/01/2019   LABRPR Non Reactive 10/09/2018    STI Results GC CT  01/21/2020 Negative Negative  01/21/2020 Negative Negative  01/21/2020 Negative Negative  07/01/2019 Negative Negative  10/09/2018 - Negative    Assessment: Marc Sandoval is here today for his 3 month PrEP follow up appointment. He continues to take Descovy without missing any doses and with no side effects or problems.  He doesn't have any concerns today for HIV or STDs but would like STD testing every 3 months just in case.   He has not gotten his COVID vaccine and has no plans to do so.  He states that he is cautious and wears a mask everywhere.   Plan: - HIV antibody, RPR, urine/oral/rectal gonorrhea/chlamydia cytology today - Descovy x 3 months if HIV negative - F/u in 3 months  Alistar Mcenery L. Tyon Cerasoli, PharmD, BCIDP, AAHIVP, CPP Clinical Pharmacist Practitioner Infectious Diseases Clinical Pharmacist [facility] 04/19/2020, 10:54 AM

## 2020-04-20 ENCOUNTER — Encounter: Payer: Self-pay | Admitting: Pharmacist

## 2020-04-20 ENCOUNTER — Other Ambulatory Visit: Payer: Self-pay | Admitting: Pharmacist

## 2020-04-20 DIAGNOSIS — Z7252 High risk homosexual behavior: Secondary | ICD-10-CM

## 2020-04-20 LAB — CYTOLOGY, (ORAL, ANAL, URETHRAL) ANCILLARY ONLY
Chlamydia: NEGATIVE
Chlamydia: NEGATIVE
Comment: NEGATIVE
Comment: NEGATIVE
Comment: NORMAL
Comment: NORMAL
Neisseria Gonorrhea: NEGATIVE
Neisseria Gonorrhea: NEGATIVE

## 2020-04-20 LAB — URINE CYTOLOGY ANCILLARY ONLY
Chlamydia: NEGATIVE
Comment: NEGATIVE
Comment: NORMAL
Neisseria Gonorrhea: NEGATIVE

## 2020-04-20 LAB — RPR: RPR Ser Ql: NONREACTIVE

## 2020-04-20 LAB — HIV ANTIBODY (ROUTINE TESTING W REFLEX): HIV 1&2 Ab, 4th Generation: NONREACTIVE

## 2020-04-20 MED ORDER — DESCOVY 200-25 MG PO TABS: 1.0000 | ORAL_TABLET | Freq: Every day | ORAL | 2 refills | Status: DC

## 2020-04-20 NOTE — Progress Notes (Signed)
Patient's HIV antibody is negative.  Will send in 3 more months of Descovy to [facility].  

## 2020-04-21 ENCOUNTER — Telehealth: Payer: Self-pay | Admitting: General Practice

## 2020-04-21 NOTE — Telephone Encounter (Signed)
Copied from CRM [id number]. Topic: General - Other >> Apr 21, 2020 10:55 AM Tamela Oddi wrote: Reason for CRM: Patient is returning a call to [facility].  Would not give details but would like a call back at [phone]

## 2020-04-22 ENCOUNTER — Telehealth: Payer: Self-pay | Admitting: General Practice

## 2020-04-22 NOTE — Telephone Encounter (Signed)
Patient called requesting to speak with [facility] regarding information she was going to provide to him. Please f/u

## 2020-04-26 ENCOUNTER — Telehealth: Payer: Self-pay | Admitting: General Practice

## 2020-04-26 ENCOUNTER — Ambulatory Visit: Payer: Medicaid Other | Admitting: Pharmacist
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
## 2020-04-30 NOTE — Telephone Encounter (Signed)
Follow up call placed to patient. Patient shared that he was notified by [facility] that he was denied from the RSVP/TCLI program. Pt has yet to hear from [facility] regarding Care Management. He endorses frustration with limited assistance regarding psychosocial stressors. Ongoing substance use. Validation and encouragement provided. Pt was informed of supportive resources including, [facility] and [facility]. Pt was encouraged to visit [facility] to assist with detox and treatment of alcohol use. Pt verbalized understanding that alcohol is a depressant and negatively impact's mental and physical health. LCSW has left message for housing program in [place], [place]. Pt continues to meditate and engage in physical activity to cope with stress.

## 2020-05-13 MED FILL — DESCOVY 200-25 MG TABS: 200-25 | 30 days supply | Qty: 30 | Fill #0

## 2020-05-15 ENCOUNTER — Encounter: Payer: Self-pay | Admitting: Emergency Medicine

## 2020-05-15 ENCOUNTER — Other Ambulatory Visit: Payer: Self-pay

## 2020-05-15 ENCOUNTER — Ambulatory Visit
Admission: EM | Admit: 2020-05-15 | Discharge: 2020-05-15 | Disposition: A | Discharge status: Home / Self Care | Payer: Medicaid Other | Attending: Emergency Medicine | Admitting: Emergency Medicine

## 2020-05-15 ENCOUNTER — Emergency Department (HOSPITAL_COMMUNITY)
Admission: EM | Admit: 2020-05-15 | Discharge: 2020-05-15 | Disposition: A | Discharge status: Home / Self Care | Payer: Medicaid Other | Attending: Emergency Medicine | Admitting: Emergency Medicine

## 2020-05-15 ENCOUNTER — Encounter (HOSPITAL_COMMUNITY): Payer: Self-pay

## 2020-05-15 DIAGNOSIS — Z7251 High risk heterosexual behavior: Secondary | ICD-10-CM | POA: Diagnosis present

## 2020-05-15 DIAGNOSIS — Z760 Encounter for issue of repeat prescription: Secondary | ICD-10-CM | POA: Diagnosis present

## 2020-05-15 DIAGNOSIS — R360 Urethral discharge without blood: Secondary | ICD-10-CM | POA: Diagnosis present

## 2020-05-15 DIAGNOSIS — R300 Dysuria: Secondary | ICD-10-CM | POA: Diagnosis not present

## 2020-05-15 DIAGNOSIS — F419 Anxiety disorder, unspecified: Secondary | ICD-10-CM | POA: Diagnosis present

## 2020-05-15 DIAGNOSIS — Z5321 Procedure and treatment not carried out due to patient leaving prior to being seen by health care provider: Secondary | ICD-10-CM | POA: Insufficient documentation

## 2020-05-15 LAB — URINALYSIS, ROUTINE W REFLEX MICROSCOPIC
Bacteria, UA: NONE SEEN
Bilirubin Urine: NEGATIVE
Glucose, UA: NEGATIVE mg/dL
Hgb urine dipstick: NEGATIVE
Ketones, ur: NEGATIVE mg/dL
Nitrite: NEGATIVE
Protein, ur: NEGATIVE mg/dL
Specific Gravity, Urine: 1.028 (ref 1.005–1.030)
WBC, UA: >50 WBC/hpf — ABNORMAL HIGH (ref 0–5)
pH: 5 (ref 5.0–8.0)

## 2020-05-15 MED ORDER — HYDROXYZINE HCL 25 MG PO TABS
25.0000 mg | ORAL_TABLET | Freq: Four times a day (QID) | ORAL | 0 refills | Status: DC
Start: 2020-05-15 — End: 2020-08-04

## 2020-05-15 MED ORDER — CEFTRIAXONE SODIUM 500 MG IJ SOLR
500.0000 mg | Freq: Once | INTRAMUSCULAR | Status: AC
  Administered 2020-05-15: 500 mg via INTRAMUSCULAR

## 2020-05-15 MED ORDER — DOXYCYCLINE HYCLATE 100 MG PO CAPS: 100.0000 mg | ORAL_CAPSULE | Freq: Two times a day (BID) | ORAL | 0 refills | Status: AC

## 2020-05-15 NOTE — ED Provider Notes (Signed)
[name]    CSN: [id number] Arrival date & time: 05/15/20  6384      History   Chief Complaint Chief Complaint  Patient presents with  . Dysuria    HPI Marc Sandoval is a 29 y.o. male  Presenting for 2-day course of dysuria.  Denying discharge, though does have urethral irritation.  No penile or testicular pain, swelling, abdominal pain, back pain, fever.  Past Medical History:  Diagnosis Date  . Anxiety   . Depression   . Hypertension   . Migraines     Patient Active Problem List   Diagnosis Date Noted  . Vitamin D deficiency 02/06/2020  . Closed fracture of left ankle 10/13/2018    History reviewed. No pertinent surgical history.     Home Medications    Prior to Admission medications   Medication Sig Start Date End Date Taking? Authorizing Provider  diclofenac (VOLTAREN) 50 MG EC tablet Take 1 tablet (50 mg total) by mouth 2 (two) times daily. Patient not taking: Reported on 05/15/2020 01/31/20   Belinda Fisher, PA-C  doxycycline (VIBRAMYCIN) 100 MG capsule Take 1 capsule (100 mg total) by mouth 2 (two) times daily for 7 days. 05/15/20 05/22/20  Hall-Potvin, [place], PA-C  emtricitabine-tenofovir AF (DESCOVY) 200-25 MG tablet Take 1 tablet by mouth daily. 04/20/20   Kuppelweiser, Cassie L, RPH-CPP  hydrOXYzine (ATARAX/VISTARIL) 25 MG tablet Take 1 tablet (25 mg total) by mouth every 6 (six) hours. 05/15/20   Hall-Potvin, [place], PA-C  mupirocin ointment (BACTROBAN) 2 % Apply 1 application topically 2 (two) times daily. 01/31/20   Cathie Hoops, Amy V, PA-C  Vitamin D, Ergocalciferol, (DRISDOL) 1.25 MG (50000 UNIT) CAPS capsule Take 1 capsule (50,000 Units total) by mouth every 7 (seven) days. 02/06/20   Arvilla Market, DO  dicyclomine (BENTYL) 20 MG tablet Take 1 tablet (20 mg total) by mouth 2 (two) times daily. 06/20/19 01/31/20  Belinda Fisher, PA-C    Family History Family History  Problem Relation Age of Onset  . Healthy Mother   . Healthy Daughter   .  Stroke Neg Hx     Social History Social History   Tobacco Use  . Smoking status: Never Smoker  . Smokeless tobacco: Never Used  Substance Use Topics  . Alcohol use: Yes    Comment: occ  . Drug use: Yes    Types: Marijuana     Allergies   Dayquil [pseudoephedrine-apap-dm], Doxylamine-phenylephrine-apap, Doxylamine-phenylephrine-apap, Nyquil hbp cold & flu [dm-doxylamine-acetaminophen], and Vancomycin   Review of Systems As per HPI   Physical Exam Triage Vital Signs ED Triage Vitals [05/15/20 0824]  Enc Vitals Group     BP 137/86     Pulse Rate 72     Resp 18     Temp 98.1 F (36.7 C)     Temp Source Oral     SpO2 98 %     Weight      Height      Head Circumference      Peak Flow      Pain Score      Pain Loc      Pain Edu?      Excl. in GC?    No data found.  Updated Vital Signs BP 137/86 (BP Location: Left Arm)   Pulse 72   Temp 98.1 F (36.7 C) (Oral)   Resp 18   SpO2 98%   Visual Acuity Right Eye Distance:   Left Eye Distance:   Bilateral  Distance:    Right Eye Near:   Left Eye Near:    Bilateral Near:     Physical Exam Constitutional:      General: He is not in acute distress. HENT:     Head: Normocephalic and atraumatic.  Eyes:     General: No scleral icterus.    Pupils: Pupils are equal, round, and reactive to light.  Cardiovascular:     Rate and Rhythm: Normal rate.  Pulmonary:     Effort: Pulmonary effort is normal. No respiratory distress.     Breath sounds: No wheezing.  Genitourinary:    Comments: No rash noted, discharge present in meatus Skin:    Coloration: Skin is not jaundiced or pale.  Neurological:     Mental Status: He is alert and oriented to person, place, and time.      UC Treatments / Results  Labs (all labs ordered are listed, but only abnormal results are displayed) Labs Reviewed  CYTOLOGY, (ORAL, ANAL, URETHRAL) ANCILLARY ONLY    EKG   Radiology No results found.  Procedures Procedures  (including critical care time)  Medications Ordered in UC Medications  cefTRIAXone (ROCEPHIN) injection 500 mg (500 mg Intramuscular Given 05/15/20 0900)    Initial Impression / Assessment and Plan / UC Course  I have reviewed the triage vital signs and the nursing notes.  Pertinent labs & imaging results that were available during my care of the patient were reviewed by me and considered in my medical decision making (see chart for details).     Appears well in office today.  Having dysuria with discharge noted on exam.  Went to ER initially for this: Urine dipstick with leukocytes, cytology pending.  Will not repeat labs here today, treat empirically for G/C.  Tolerated Rocephin well.  Patient requesting medication refill for anxiety medication that he "takes daily".  Per chart review none on file.  Will call PCP Monday to schedule follow-up, touch base with his LCSW.  Will provide hydroxyzine in the interim.  No SI/HI.  Return precautions discussed, pt verbalized understanding and is agreeable to plan. Final Clinical Impressions(s) / UC Diagnoses   Final diagnoses:  Dysuria  Penile discharge, without blood  Encounter for medication refill  Unprotected sex     Discharge Instructions     Today you received treatment for chlamydia, gonorrhea. Testing for chlamydia, gonorrhea, trichomonas is pending: please look for these results on the MyChart app/website.  We will notify you if you are positive and outline treatment at that time.  Important to avoid all forms of sexual intercourse (oral, vaginal, anal) with any/all partners for the next 7 days to avoid spreading/reinfecting. Any/all sexual partners should be notified of testing/treatment today.  Return for persistent/worsening symptoms or if you develop fever, abdominal or pelvic pain, discharge, genital pain, blood in your urine, or are re-exposed to an STI.    ED Prescriptions    Medication Sig Dispense Auth. Provider    doxycycline (VIBRAMYCIN) 100 MG capsule Take 1 capsule (100 mg total) by mouth 2 (two) times daily for 7 days. 14 capsule Hall-Potvin, [place], PA-C   hydrOXYzine (ATARAX/VISTARIL) 25 MG tablet Take 1 tablet (25 mg total) by mouth every 6 (six) hours. 12 tablet Hall-Potvin, [place], PA-C     PDMP not reviewed this encounter.   Odette Fraction [place], [place] 05/15/20 8850
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
## 2020-05-15 NOTE — Discharge Instructions (Signed)
Today you received treatment for chlamydia, gonorrhea. Testing for chlamydia, gonorrhea, trichomonas is pending: please look for these results on the MyChart app/website.  We will notify you if you are positive and outline treatment at that time.  Important to avoid all forms of sexual intercourse (oral, vaginal, anal) with any/all partners for the next 7 days to avoid spreading/reinfecting. Any/all sexual partners should be notified of testing/treatment today.  Return for persistent/worsening symptoms or if you develop fever, abdominal or pelvic pain, discharge, genital pain, blood in your urine, or are re-exposed to an STI. 
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
## 2020-05-17 ENCOUNTER — Other Ambulatory Visit: Payer: Self-pay | Admitting: Pharmacist

## 2020-05-17 DIAGNOSIS — Z7252 High risk homosexual behavior: Secondary | ICD-10-CM

## 2020-05-17 LAB — GC/CHLAMYDIA PROBE AMP (~~LOC~~) NOT AT ARMC
Chlamydia: NEGATIVE
Comment: NEGATIVE
Comment: NORMAL
Neisseria Gonorrhea: POSITIVE — AB

## 2020-05-18 ENCOUNTER — Other Ambulatory Visit: Payer: Self-pay

## 2020-05-18 DIAGNOSIS — Z7252 High risk homosexual behavior: Secondary | ICD-10-CM

## 2020-05-19 ENCOUNTER — Telehealth: Payer: Self-pay

## 2020-05-19 ENCOUNTER — Telehealth: Payer: Self-pay | Admitting: Licensed Clinical Social Worker

## 2020-05-19 ENCOUNTER — Telehealth (INDEPENDENT_AMBULATORY_CARE_PROVIDER_SITE_OTHER): Payer: Self-pay | Admitting: General Practice

## 2020-05-19 ENCOUNTER — Telehealth: Payer: Self-pay | Admitting: Emergency Medicine

## 2020-05-19 LAB — CYTOLOGY, (ORAL, ANAL, URETHRAL) ANCILLARY ONLY
Chlamydia: NEGATIVE
Comment: NEGATIVE
Comment: NEGATIVE
Comment: NORMAL
Neisseria Gonorrhea: POSITIVE — AB
Trichomonas: NEGATIVE

## 2020-05-19 NOTE — Telephone Encounter (Signed)
Copied from CRM [id number]. Topic: General - Other >> May 19, 2020 12:29 PM Marc Sandoval wrote: Reason for CRM: patient would like Jasmine to call him back today due to an urgent matter. Patient did not want to elaborate but did stat if jasmine can not call him back can someone from the office return his call.
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
## 2020-05-19 NOTE — Telephone Encounter (Signed)
Return call placed to patient, per request. LCSW left message for a return call.

## 2020-05-23 LAB — HCV RNA,QUANTITATIVE REAL TIME PCR
HCV Quantitative Log: <1.18 Log IU/mL — AB
HCV RNA, PCR, QN: <15 IU/mL — AB

## 2020-05-23 LAB — HEPATITIS C ANTIBODY
Hepatitis C Ab: REACTIVE — AB
SIGNAL TO CUT-OFF: 1.71 — ABNORMAL HIGH (ref <1.00)

## 2020-05-23 LAB — HIV ANTIBODY (ROUTINE TESTING W REFLEX): HIV 1&2 Ab, 4th Generation: NONREACTIVE

## 2020-05-24 ENCOUNTER — Encounter: Payer: Self-pay | Admitting: Pharmacist

## 2020-06-10 MED FILL — DESCOVY 200-25 MG TABS: 200-25 | 30 days supply | Qty: 30 | Fill #1

## 2020-06-25 ENCOUNTER — Telehealth: Payer: Self-pay

## 2020-06-25 NOTE — Telephone Encounter (Signed)
Pt messaged the office the please fu with him

## 2020-06-28 ENCOUNTER — Encounter: Payer: Self-pay | Admitting: Pharmacist

## 2020-07-06 NOTE — Telephone Encounter (Signed)
Follow up call placed. LCSW left message requesting a return call.

## 2020-07-07 ENCOUNTER — Telehealth: Payer: Self-pay

## 2020-07-07 ENCOUNTER — Telehealth: Payer: Self-pay | Admitting: General Practice

## 2020-07-07 NOTE — Telephone Encounter (Signed)
Pt called and suffers of severe depression with suicidal ideations. request a welfare check. bc he called today threatening to harm himself. Pt stated that whatever happens to him to day is on the office due to [place] not be constant with him. He also stated he never wanted to hear from jasmin again.   Pt called social worker yesterday social work stated she would call him back.

## 2020-07-07 NOTE — Telephone Encounter (Signed)
Pt called and suffers of severe depression with suicidal ideations. request a welfare check. bc he called today threatening to harm himself. Pt called social worker yesterday social work stated she would call him back.
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
## 2020-07-16 ENCOUNTER — Telehealth: Payer: Self-pay

## 2020-07-16 NOTE — Telephone Encounter (Signed)
[facility] Patient Advocate Encounter  [facility] and I have been unsuccsessful in reaching patient to be able to refill medication.   Last filled Descovy on 06/10/20  We have tried multiple times without a response.  Clearance Coots, CPhT Specialty Pharmacy Patient [facility] Phone: [phone] Fax:  [phone]

## 2020-07-20 ENCOUNTER — Ambulatory Visit (INDEPENDENT_AMBULATORY_CARE_PROVIDER_SITE_OTHER): Payer: Self-pay | Admitting: Licensed Clinical Social Worker

## 2020-07-20 ENCOUNTER — Other Ambulatory Visit: Payer: Self-pay

## 2020-07-20 DIAGNOSIS — F4310 Post-traumatic stress disorder, unspecified: Secondary | ICD-10-CM
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
## 2020-07-27 ENCOUNTER — Ambulatory Visit (INDEPENDENT_AMBULATORY_CARE_PROVIDER_SITE_OTHER): Payer: Medicaid Other | Admitting: Licensed Clinical Social Worker

## 2020-08-02 ENCOUNTER — Ambulatory Visit: Payer: Medicaid Other | Admitting: Pharmacist

## 2020-08-02 ENCOUNTER — Ambulatory Visit: Payer: Medicaid Other | Admitting: Internal Medicine

## 2020-08-03 ENCOUNTER — Ambulatory Visit: Payer: Self-pay | Admitting: Pharmacist

## 2020-08-03 ENCOUNTER — Other Ambulatory Visit: Payer: Self-pay

## 2020-08-03 DIAGNOSIS — Z79899 Other long term (current) drug therapy: Secondary | ICD-10-CM

## 2020-08-03 DIAGNOSIS — Z7252 High risk homosexual behavior: Secondary | ICD-10-CM

## 2020-08-03 NOTE — Progress Notes (Signed)
Date:  08/03/2020   HPI: Marc Sandoval is a 29 y.o. male who presents to the [facility] pharmacy clinic for HIV PrEP follow-up.  Insured   []    Uninsured  [x]    Patient Active Problem List   Diagnosis Date Noted  . Vitamin D deficiency 02/06/2020  . Closed fracture of left ankle 10/13/2018    Patient's Medications  New Prescriptions   No medications on file  Previous Medications   DICLOFENAC (VOLTAREN) 50 MG EC TABLET    Take 1 tablet (50 mg total) by mouth 2 (two) times daily.   EMTRICITABINE-TENOFOVIR AF (DESCOVY) 200-25 MG TABLET    Take 1 tablet by mouth daily.   HYDROXYZINE (ATARAX/VISTARIL) 25 MG TABLET    Take 1 tablet (25 mg total) by mouth every 6 (six) hours.   MUPIROCIN OINTMENT (BACTROBAN) 2 %    Apply 1 application topically 2 (two) times daily.   VITAMIN D, ERGOCALCIFEROL, (DRISDOL) 1.25 MG (50000 UNIT) CAPS CAPSULE    Take 1 capsule (50,000 Units total) by mouth every 7 (seven) days.  Modified Medications   No medications on file  Discontinued Medications   No medications on file    Allergies: Allergies  Allergen Reactions  . Dayquil [Pseudoephedrine-Apap-Dm] Hives  . Doxylamine-Phenylephrine-Apap Hives  . Doxylamine-Phenylephrine-Apap Hives  . Nyquil Hbp Cold & Flu [Dm-Doxylamine-Acetaminophen] Hives  . Vancomycin     Past Medical History: Past Medical History:  Diagnosis Date  . Anxiety   . Depression   . Hypertension   . Migraines     Social History: Social History   Socioeconomic History  . Marital status: Single    Spouse name: Not on file  . Number of children: Not on file  . Years of education: Not on file  . Highest education level: Not on file  Occupational History  . Not on file  Tobacco Use  . Smoking status: Never Smoker  . Smokeless tobacco: Never Used  Substance and Sexual Activity  . Alcohol use: Yes    Comment: occ  . Drug use: Yes    Types: Marijuana  . Sexual activity: Not on file  Other Topics Concern  . Not on file    Social History Narrative   ** Merged History Encounter **       Social Determinants of Health   Financial Resource Strain:   . Difficulty of Paying Living Expenses: Not on file  Food Insecurity:   . Worried About 04/07/2020 in the Last Year: Not on file  . Ran Out of Food in the Last Year: Not on file  Transportation Needs:   . Lack of Transportation (Medical): Not on file  . Lack of Transportation (Non-Medical): Not on file  Physical Activity:   . Days of Exercise per Week: Not on file  . Minutes of Exercise per Session: Not on file  Stress:   . Feeling of Stress : Not on file  Social Connections:   . Frequency of Communication with Friends and Family: Not on file  . Frequency of Social Gatherings with Friends and Family: Not on file  . Attends Religious Services: Not on file  . Active Member of Clubs or Organizations: Not on file  . Attends 12/12/2018 Meetings: Not on file  . Marital Status: Not on file    CHL HIV PREP FLOWSHEET RESULTS 01/21/2020 07/15/2019  Insurance Status Uninsured Uninsured  Gender at birth Male Male  Gender identity cis-Male cis-Male  Risk for HIV -  In sexual relationship with HIV+ partner;Condomless vaginal or anal intercourse;Hx of STI  Sex Partners Both men and women Both men and women  # sex partners past 3-6 mos 1-3 1-3  Sex activity preferences Insertive;Oral Insertive;Oral  Condom use Yes No  % condom use 100 -  Treated for STI? No No  HIV symptoms? - N/A  PrEP Eligibility - Substantial risk for HIV    Labs:  SCr: Lab Results  Component Value Date   CREATININE 1.04 01/29/2020   CREATININE 1.07 01/21/2020   CREATININE 0.97 07/15/2019   CREATININE 1.03 05/22/2019   CREATININE 0.93 10/09/2018   HIV Lab Results  Component Value Date   HIV NON-REACTIVE 05/18/2020   HIV NON-REACTIVE 04/19/2020   HIV NON-REACTIVE 01/21/2020   HIV NON-REACTIVE 08/13/2019   HIV NON-REACTIVE 07/15/2019   Hepatitis B Lab Results   Component Value Date   HEPBSAB REACTIVE (A) 07/15/2019   HEPBSAG NON-REACTIVE 07/15/2019   Hepatitis C Lab Results  Component Value Date   HEPCAB REACTIVE (A) 05/18/2020   HCVRNAPCRQN <15 DETECTED (A) 05/18/2020   Hepatitis A Lab Results  Component Value Date   HAV REACTIVE (A) 07/15/2019   RPR and STI Lab Results  Component Value Date   LABRPR NON-REACTIVE 04/19/2020   LABRPR NON-REACTIVE 01/21/2020   LABRPR Non Reactive 07/01/2019   LABRPR Non Reactive 10/09/2018    STI Results GC CT  05/15/2020 Positive(A) Negative  05/15/2020 Positive(A) Negative  04/19/2020 Negative Negative  04/19/2020 Negative Negative  04/19/2020 Negative Negative  01/21/2020 Negative Negative  01/21/2020 Negative Negative  01/21/2020 Negative Negative  07/01/2019 Negative Negative  10/09/2018 - Negative    Assessment: Marc Sandoval presents to clinic today for his 73-month PrEP follow-up appointment. He has been taking Descovy without any noted side effects or missed doses. He inquired if Descovy could cause depression though he described this as an ongoing experience prior to starting PrEP. Informed him that this was likely attributed to his prior depression rather than related to the medication. He has enough medicine at this time. Will check HIV ab and send in 22-month refills to [facility] if HIV ab negative. Given Cassie will be on maternity leave, will have him follow-up with lab in 3 months and then follow-up with Cassie 3 months later.  Patient requested oral and urine cytologies today; no signs or symptoms of STIs were noted. Will also check a CMet today given his most recent BMet was from May. Additionally, patient states he has been experiencing intermittent sharp abdominal side pains. He stated he has been drinking alcohol more often recently, so will assess LFTs today.   Plan: Check HIV ab,CMet, and urine/oral cytologies today Refill Descovy x 6 months if HIV ab negative  Follow-up lab appointment on  11/02/20 @ 10:45AM Follow-up appointment with Cassie on 02/02/21 @ 10:45AM  Margarite Gouge, PharmD PGY2 ID Pharmacy Resident [phone]  08/03/2020, 2:49 PM

## 2020-08-04 ENCOUNTER — Ambulatory Visit: Payer: Self-pay | Attending: Family Medicine | Admitting: Family Medicine

## 2020-08-04 ENCOUNTER — Other Ambulatory Visit: Payer: Self-pay | Admitting: Pharmacist

## 2020-08-04 ENCOUNTER — Encounter: Payer: Self-pay | Admitting: Family Medicine

## 2020-08-04 VITALS — BP 130/73 | HR 75 | Ht 76.0 in | Wt 165.0 lb

## 2020-08-04 DIAGNOSIS — F419 Anxiety disorder, unspecified: Secondary | ICD-10-CM | POA: Insufficient documentation

## 2020-08-04 DIAGNOSIS — R630 Anorexia: Secondary | ICD-10-CM | POA: Insufficient documentation

## 2020-08-04 DIAGNOSIS — F32A Depression, unspecified: Secondary | ICD-10-CM | POA: Insufficient documentation

## 2020-08-04 DIAGNOSIS — Z5901 Sheltered homelessness: Secondary | ICD-10-CM | POA: Insufficient documentation

## 2020-08-04 DIAGNOSIS — Z206 Contact with and (suspected) exposure to human immunodeficiency virus [HIV]: Secondary | ICD-10-CM | POA: Insufficient documentation

## 2020-08-04 DIAGNOSIS — F4310 Post-traumatic stress disorder, unspecified: Secondary | ICD-10-CM | POA: Insufficient documentation

## 2020-08-04 DIAGNOSIS — Z634 Disappearance and death of family member: Secondary | ICD-10-CM | POA: Insufficient documentation

## 2020-08-04 DIAGNOSIS — Z79899 Other long term (current) drug therapy: Secondary | ICD-10-CM | POA: Insufficient documentation

## 2020-08-04 DIAGNOSIS — Z6820 Body mass index (BMI) 20.0-20.9, adult: Secondary | ICD-10-CM | POA: Insufficient documentation

## 2020-08-04 LAB — HIV ANTIBODY (ROUTINE TESTING W REFLEX): HIV 1&2 Ab, 4th Generation: NONREACTIVE

## 2020-08-04 LAB — CYTOLOGY, (ORAL, ANAL, URETHRAL) ANCILLARY ONLY
Chlamydia: NEGATIVE
Comment: NEGATIVE
Comment: NORMAL
Neisseria Gonorrhea: POSITIVE — AB

## 2020-08-04 LAB — URINE CYTOLOGY ANCILLARY ONLY
Chlamydia: NEGATIVE
Comment: NEGATIVE
Comment: NORMAL
Neisseria Gonorrhea: NEGATIVE

## 2020-08-04 LAB — COMPREHENSIVE METABOLIC PANEL
AG Ratio: 1.6 (calc) (ref 1.0–2.5)
ALT: 8 U/L — ABNORMAL LOW (ref 9–46)
AST: 18 U/L (ref 10–40)
Albumin: 4.6 g/dL (ref 3.6–5.1)
Alkaline phosphatase (APISO): 58 U/L (ref 36–130)
BUN: 10 mg/dL (ref 7–25)
CO2: 29 mmol/L (ref 20–32)
Calcium: 9.6 mg/dL (ref 8.6–10.3)
Chloride: 104 mmol/L (ref 98–110)
Creat: 1.09 mg/dL (ref 0.60–1.35)
Globulin: 2.8 g/dL (calc) (ref 1.9–3.7)
Glucose, Bld: 87 mg/dL (ref 65–99)
Potassium: 4.3 mmol/L (ref 3.5–5.3)
Sodium: 139 mmol/L (ref 135–146)
Total Bilirubin: 0.6 mg/dL (ref 0.2–1.2)
Total Protein: 7.4 g/dL (ref 6.1–8.1)

## 2020-08-04 MED ORDER — PAROXETINE HCL 20 MG PO TABS: 20.0000 mg | ORAL_TABLET | Freq: Every day | ORAL | 3 refills | Status: DC

## 2020-08-04 MED ORDER — DESCOVY 200-25 MG PO TABS: 1.0000 | ORAL_TABLET | Freq: Every day | ORAL | 5 refills | Status: DC

## 2020-08-04 MED ORDER — HYDROXYZINE HCL 25 MG PO TABS: 25.0000 mg | ORAL_TABLET | Freq: Four times a day (QID) | ORAL | 1 refills | Status: DC

## 2020-08-04 NOTE — Progress Notes (Signed)
Depression and anxiety.  Has frequent headaches.
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
## 2020-08-05 ENCOUNTER — Telehealth: Payer: Self-pay

## 2020-08-05 ENCOUNTER — Ambulatory Visit (INDEPENDENT_AMBULATORY_CARE_PROVIDER_SITE_OTHER): Payer: Self-pay | Admitting: *Deleted

## 2020-08-05 ENCOUNTER — Encounter: Payer: Self-pay | Admitting: Pharmacist

## 2020-08-05 ENCOUNTER — Other Ambulatory Visit: Payer: Self-pay

## 2020-08-05 DIAGNOSIS — A549 Gonococcal infection, unspecified: Secondary | ICD-10-CM

## 2020-08-05 MED ORDER — CEFTRIAXONE SODIUM 500 MG IJ SOLR
500.0000 mg | Freq: Once | INTRAMUSCULAR | Status: AC
  Administered 2020-08-05: 500 mg via INTRAMUSCULAR

## 2020-08-05 MED FILL — HYDROXYZINE HCL 25 MG TABS: 25 | 8 days supply | Qty: 30 | Fill #0

## 2020-08-05 MED FILL — PARoxetine HCL 20 MG TABS: 20 | 30 days supply | Qty: 30 | Fill #0

## 2020-08-05 NOTE — Telephone Encounter (Signed)
[facility] Patient Advocate Encounter  Completed and sent [employer] Advancing Access application for DESCOVY for this patient who is uninsured.    Patient assistance phone number for follow up is [phone].   This encounter will be updated until final determination.,   Clearance Coots, CPhT Specialty Pharmacy Patient [facility] Phone: [phone] Fax:  [phone]

## 2020-08-05 NOTE — Progress Notes (Signed)
RN reviewed labs and allergies, offered condoms, advised patient to remain abstinent for 7-10 days after treatment, and that the health department may follow up with him to confirm treatment. Patient's questions answered to their satisfaction. Reshawn Ostlund M, RN  

## 2020-08-06 ENCOUNTER — Telehealth: Payer: Self-pay

## 2020-08-06 ENCOUNTER — Telehealth: Payer: Self-pay | Admitting: Licensed Clinical Social Worker

## 2020-08-06 MED FILL — DESCOVY 200-25 MG TABS: 200-25 | 30 days supply | Qty: 30 | Fill #0

## 2020-08-06 NOTE — Telephone Encounter (Signed)
Follow up call placed to patient. LCSW left message requesting a return call.  

## 2020-08-06 NOTE — Telephone Encounter (Signed)
[facility] Patient Advocate Encounter  Completed and sent [employer] Advancing Access application for DESCOVY for this patient who is uninsured.    Patient is approved 08/06/20 through 08/06/21.  BIN      [id number] PCN    [id number] GRP    [id number] ID        [id number]   Clearance Coots, CPhT Specialty Pharmacy Patient [facility] Phone: [phone] Fax:  [phone]

## 2020-08-12 ENCOUNTER — Telehealth: Payer: Self-pay | Admitting: Family Medicine

## 2020-08-12 ENCOUNTER — Encounter: Payer: Self-pay | Admitting: Family Medicine
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
## 2020-08-13 ENCOUNTER — Encounter: Payer: Self-pay | Admitting: Licensed Clinical Social Worker

## 2020-08-13 ENCOUNTER — Encounter: Payer: Self-pay | Admitting: Family Medicine

## 2020-08-13 ENCOUNTER — Telehealth: Payer: Self-pay | Admitting: Licensed Clinical Social Worker

## 2020-08-13 NOTE — Telephone Encounter (Signed)
MyChart message
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
## 2020-08-16 ENCOUNTER — Ambulatory Visit: Payer: Medicaid Other | Admitting: Family Medicine

## 2020-08-16 NOTE — Telephone Encounter (Signed)
Letter has been sent via my chart to the patient

## 2020-08-19 ENCOUNTER — Ambulatory Visit
Admission: EM | Admit: 2020-08-19 | Discharge: 2020-08-19 | Disposition: A | Discharge status: Home / Self Care | Payer: Medicaid Other | Attending: Emergency Medicine | Admitting: Emergency Medicine

## 2020-08-19 ENCOUNTER — Other Ambulatory Visit: Payer: Self-pay

## 2020-08-19 DIAGNOSIS — R300 Dysuria: Secondary | ICD-10-CM | POA: Insufficient documentation

## 2020-08-19 MED ORDER — CEFTRIAXONE SODIUM 500 MG IJ SOLR
500.0000 mg | Freq: Once | INTRAMUSCULAR | Status: AC
  Administered 2020-08-19: 19:00:00 500 mg via INTRAMUSCULAR

## 2020-08-19 MED ORDER — DOXYCYCLINE HYCLATE 100 MG PO CAPS: 100.0000 mg | ORAL_CAPSULE | Freq: Two times a day (BID) | ORAL | 0 refills | Status: DC

## 2020-08-19 NOTE — Discharge Instructions (Addendum)
We have treated you today for gonorrhea, with rocephin.  Begin doxycycline twice daily for the next week.  Please refrain from sexual activity for 7 days while medicine is clearing infection.  We are testing you for Gonorrhea, Chlamydia and Trichomonas. We will call you if anything is positive and let you know if you require any further treatment. Please inform partner of any positive results.  Please return if symptoms not improving with treatment, development of fever, nausea, vomiting, abdominal pain, scrotal pain.

## 2020-08-19 NOTE — ED Triage Notes (Signed)
Patient states he has painful and burning urination and has been treated about 2 weeks ago with a course of antibiotics. Pt is aox4 and ambulatory.

## 2020-08-19 NOTE — ED Provider Notes (Signed)
[name]    CSN: [id number] Arrival date & time: 08/19/20  1705      History   Chief Complaint Chief Complaint  Patient presents with   painful urination    X 2 days    HPI Marc Sandoval is a 29 y.o. male history of hypertension, migraines, presenting today for evaluation of dysuria.  Reports over the past 2 days has had dysuria.  Denies any penile discharge.  Does report 2 weeks ago tested positive for gonorrhea orally.  Was treated for this.  Denies sexual intercourse since, but has kissed his partner and has had slight throat irritation.  HPI  Past Medical History:  Diagnosis Date   Anxiety    Depression    Hypertension    Migraines     Patient Active Problem List   Diagnosis Date Noted   Vitamin D deficiency 02/06/2020   Closed fracture of left ankle 10/13/2018    Past Surgical History:  Procedure Laterality Date   skin graft on back         Home Medications    Prior to Admission medications   Medication Sig Start Date End Date Taking? Authorizing Provider  doxycycline (VIBRAMYCIN) 100 MG capsule Take 1 capsule (100 mg total) by mouth 2 (two) times daily for 7 days. 08/19/20 08/26/20  Victorina Kable C, PA-C  emtricitabine-tenofovir AF (DESCOVY) 200-25 MG tablet Take 1 tablet by mouth daily. 08/04/20   Kuppelweiser, Cassie L, RPH-CPP  hydrOXYzine (ATARAX/VISTARIL) 25 MG tablet Take 1 tablet (25 mg total) by mouth every 6 (six) hours. 08/04/20   Hoy Register, MD  PARoxetine (PAXIL) 20 MG tablet Take 1 tablet (20 mg total) by mouth daily. 08/04/20   Hoy Register, MD  Vitamin D, Ergocalciferol, (DRISDOL) 1.25 MG (50000 UNIT) CAPS capsule Take 1 capsule (50,000 Units total) by mouth every 7 (seven) days. Patient not taking: Reported on 08/04/2020 02/06/20   Arvilla Market, DO  dicyclomine (BENTYL) 20 MG tablet Take 1 tablet (20 mg total) by mouth 2 (two) times daily. 06/20/19 01/31/20  Belinda Fisher, PA-C    Family  History Family History  Problem Relation Age of Onset   Healthy Mother    Healthy Daughter    Stroke Neg Hx     Social History Social History   Tobacco Use   Smoking status: Never Smoker   Smokeless tobacco: Never Used  Building services engineer Use: Never used  Substance Use Topics   Alcohol use: Yes    Comment: occ   Drug use: Yes    Types: Marijuana     Allergies   Dayquil [pseudoephedrine-apap-dm], Doxylamine-phenylephrine-apap, Doxylamine-phenylephrine-apap, Nyquil hbp cold & flu [dm-doxylamine-acetaminophen], and Vancomycin   Review of Systems Review of Systems  Constitutional: Negative for fever.  HENT: Negative for sore throat.   Respiratory: Negative for shortness of breath.   Cardiovascular: Negative for chest pain.  Gastrointestinal: Negative for abdominal pain, nausea and vomiting.  Genitourinary: Positive for dysuria. Negative for difficulty urinating, frequency, penile discharge, penile pain, penile swelling, scrotal swelling and testicular pain.  Skin: Negative for rash.  Neurological: Negative for dizziness, light-headedness and headaches.     Physical Exam Triage Vital Signs ED Triage Vitals  Enc Vitals Group     BP 08/19/20 1811 130/89     Pulse Rate 08/19/20 1811 66     Resp 08/19/20 1811 18     Temp 08/19/20 1811 98 F (36.7 C)     Temp Source 08/19/20 1811  Oral     SpO2 08/19/20 1811 99 %     Weight --      Height --      Head Circumference --      Peak Flow --      Pain Score 08/19/20 1812 2     Pain Loc --      Pain Edu? --      Excl. in GC? --    No data found.  Updated Vital Signs BP 130/89 (BP Location: Left Arm)    Pulse 66    Temp 98 F (36.7 C) (Oral)    Resp 18    SpO2 99%   Visual Acuity Right Eye Distance:   Left Eye Distance:   Bilateral Distance:    Right Eye Near:   Left Eye Near:    Bilateral Near:     Physical Exam Vitals and nursing note reviewed.  Constitutional:      Appearance: He is  well-developed and well-nourished.     Comments: No acute distress  HENT:     Head: Normocephalic and atraumatic.     Nose: Nose normal.  Eyes:     Conjunctiva/sclera: Conjunctivae normal.  Cardiovascular:     Rate and Rhythm: Normal rate.  Pulmonary:     Effort: Pulmonary effort is normal. No respiratory distress.  Abdominal:     General: There is no distension.  Musculoskeletal:        General: Normal range of motion.     Cervical back: Neck supple.  Skin:    General: Skin is warm and dry.  Neurological:     Mental Status: He is alert and oriented to person, place, and time.  Psychiatric:        Mood and Affect: Mood and affect normal.      UC Treatments / Results  Labs (all labs ordered are listed, but only abnormal results are displayed) Labs Reviewed  CYTOLOGY, (ORAL, ANAL, URETHRAL) ANCILLARY ONLY  CYTOLOGY, (ORAL, ANAL, URETHRAL) ANCILLARY ONLY    EKG   Radiology No results found.  Procedures Procedures (including critical care time)  Medications Ordered in UC Medications  cefTRIAXone (ROCEPHIN) injection 500 mg (500 mg Intramuscular Given 08/19/20 1848)    Initial Impression / Assessment and Plan / UC Course  I have reviewed the triage vital signs and the nursing notes.  Pertinent labs & imaging results that were available during my care of the patient were reviewed by me and considered in my medical decision making (see chart for details).     Repeating oral and urethral cytology for gonorrhea chlamydia and trichomonas.  Empirically treating for gonorrhea and chlamydia today with Rocephin and doxycycline.  Swabs pending and will provide further treatment as needed.  Given recent positive, if continuing to be positive will recommend follow-up to resolution.  Discussed strict return precautions. Patient verbalized understanding and is agreeable with plan.  Final Clinical Impressions(s) / UC Diagnoses   Final diagnoses:  Dysuria     Discharge  Instructions     We have treated you today for gonorrhea, with rocephin.  Begin doxycycline twice daily for the next week.  Please refrain from sexual activity for 7 days while medicine is clearing infection.  We are testing you for Gonorrhea, Chlamydia and Trichomonas. We will call you if anything is positive and let you know if you require any further treatment. Please inform partner of any positive results.  Please return if symptoms not improving with treatment, development of fever,  nausea, vomiting, abdominal pain, scrotal pain.    ED Prescriptions    Medication Sig Dispense Auth. Provider   doxycycline (VIBRAMYCIN) 100 MG capsule Take 1 capsule (100 mg total) by mouth 2 (two) times daily for 7 days. 14 capsule Markeith Jue, [place] C, PA-C     PDMP not reviewed this encounter.   Lew Dawes, [place] 08/19/20 1901

## 2020-08-23 ENCOUNTER — Other Ambulatory Visit: Payer: Self-pay | Admitting: Emergency Medicine

## 2020-08-23 ENCOUNTER — Telehealth: Payer: Self-pay | Admitting: Emergency Medicine

## 2020-08-23 LAB — CYTOLOGY, (ORAL, ANAL, URETHRAL) ANCILLARY ONLY
Chlamydia: NEGATIVE
Comment: NEGATIVE
Comment: NEGATIVE
Comment: NORMAL
Neisseria Gonorrhea: NEGATIVE
Trichomonas: NEGATIVE

## 2020-08-23 MED ORDER — DOXYCYCLINE HYCLATE 100 MG PO CAPS: 100.0000 mg | ORAL_CAPSULE | Freq: Two times a day (BID) | ORAL | 0 refills | Status: DC

## 2020-08-23 MED FILL — DOXYCYCLINE HYCLATE 100 MG: 100 | 7 days supply | Qty: 14 | Fill #0

## 2020-08-23 NOTE — Telephone Encounter (Signed)
Changing pharmacy from visit

## 2020-08-24 LAB — CYTOLOGY, (ORAL, ANAL, URETHRAL) ANCILLARY ONLY
Chlamydia: NEGATIVE
Comment: NEGATIVE
Comment: NEGATIVE
Comment: NORMAL
Neisseria Gonorrhea: NEGATIVE
Trichomonas: NEGATIVE

## 2020-08-31 ENCOUNTER — Other Ambulatory Visit: Payer: Self-pay | Admitting: Family Medicine

## 2020-08-31 ENCOUNTER — Encounter (HOSPITAL_BASED_OUTPATIENT_CLINIC_OR_DEPARTMENT_OTHER): Payer: Self-pay | Admitting: Family Medicine

## 2020-08-31 DIAGNOSIS — F4310 Post-traumatic stress disorder, unspecified: Secondary | ICD-10-CM

## 2020-08-31 DIAGNOSIS — K0889 Other specified disorders of teeth and supporting structures: Secondary | ICD-10-CM

## 2020-08-31 DIAGNOSIS — F419 Anxiety disorder, unspecified: Secondary | ICD-10-CM

## 2020-08-31 MED ORDER — BUPROPION HCL ER (SR) 150 MG PO TB12: 150.0000 mg | ORAL_TABLET | Freq: Two times a day (BID) | ORAL | 1 refills | Status: DC

## 2020-08-31 MED ORDER — HYDROXYZINE HCL 50 MG PO TABS: 50.0000 mg | ORAL_TABLET | Freq: Two times a day (BID) | ORAL | 0 refills | Status: DC | PRN

## 2020-08-31 NOTE — Telephone Encounter (Signed)
Patient concerns addressed. Paxil changed to Wellbutrin due to side effects of the former Hydroxyzine dose increased due to ineffectiveness Dental referral placed as per request Letter updated per request.

## 2020-08-31 NOTE — Telephone Encounter (Signed)
Pt would like to do a virtual appt instead of an in office appt.  He wants to be seen asap.  Before his appt date on the 11th  CB#  [phone]

## 2020-09-01 ENCOUNTER — Telehealth: Payer: Self-pay | Admitting: Licensed Clinical Social Worker

## 2020-09-01 ENCOUNTER — Other Ambulatory Visit: Payer: Self-pay | Admitting: Family Medicine

## 2020-09-01 DIAGNOSIS — F4310 Post-traumatic stress disorder, unspecified: Secondary | ICD-10-CM

## 2020-09-01 DIAGNOSIS — F419 Anxiety disorder, unspecified: Secondary | ICD-10-CM

## 2020-09-01 MED ORDER — HYDROXYZINE HCL 50 MG PO TABS: 50.0000 mg | ORAL_TABLET | Freq: Two times a day (BID) | ORAL | 0 refills | Status: DC | PRN

## 2020-09-01 MED ORDER — BUPROPION HCL ER (SR) 150 MG PO TB12: 150.0000 mg | ORAL_TABLET | Freq: Two times a day (BID) | ORAL | 1 refills | Status: DC

## 2020-09-01 MED FILL — hydrOXYzine HCL 50 MG TABS: 50 | 30 days supply | Qty: 60 | Fill #0

## 2020-09-01 MED FILL — BUPROPION HCL SR 150 MG TAB: 150 | 30 days supply | Qty: 60 | Fill #0

## 2020-09-01 NOTE — Telephone Encounter (Signed)
Incoming call received from patient.   Patient shared that he has abstained from alcohol and marijuana use since 08/16/20 and plans to maintain sobriety. He reports a decrease in depression symptoms; however, an increase in frequency and severity of panic attacks, including shortness of breath in the mornings when he awakens for the day. Denies current SI/HI.  LCSW informed patient that PCP has adjusted his medications and has sent prescriptions to his preferred pharmacy. Pt was informed that concerns regarding a letter regarding the trauma will be addressed at upcoming appointment, scheduled for 09/14/20. Pt verbalized understanding. Front desk staff was contacted to coordinate transportation to and from appointment.   LCSW strongly encouraged patient to establish therapy and medication management through [facility] to strengthen support system and assist with management of PTSD symptoms that are negatively impacting his physical and mental health.  Pt agreed to visit [facility] during walk-in hours prior to PCP appointment. Pt agreed to follow up with LCSW via phone next week. No additional concerns noted.

## 2020-09-03 ENCOUNTER — Other Ambulatory Visit: Payer: Self-pay | Admitting: Family Medicine

## 2020-09-03 DIAGNOSIS — R6889 Other general symptoms and signs: Secondary | ICD-10-CM

## 2020-09-06 ENCOUNTER — Ambulatory Visit (INDEPENDENT_AMBULATORY_CARE_PROVIDER_SITE_OTHER): Payer: No Payment, Other | Admitting: Licensed Clinical Social Worker

## 2020-09-06 ENCOUNTER — Other Ambulatory Visit: Payer: Self-pay

## 2020-09-06 DIAGNOSIS — F331 Major depressive disorder, recurrent, moderate: Secondary | ICD-10-CM | POA: Diagnosis not present

## 2020-09-06 DIAGNOSIS — F410 Panic disorder [episodic paroxysmal anxiety] without agoraphobia: Secondary | ICD-10-CM | POA: Insufficient documentation

## 2020-09-06 DIAGNOSIS — F1911 Other psychoactive substance abuse, in remission: Secondary | ICD-10-CM | POA: Insufficient documentation
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
## 2020-09-08 ENCOUNTER — Telehealth: Payer: Self-pay | Admitting: Pharmacist

## 2020-09-08 ENCOUNTER — Other Ambulatory Visit: Payer: Self-pay

## 2020-09-08 ENCOUNTER — Encounter: Payer: Self-pay | Admitting: Dietician

## 2020-09-08 ENCOUNTER — Encounter: Payer: Medicaid Other | Attending: Family Medicine | Admitting: Dietician

## 2020-09-08 VITALS — Ht 76.0 in | Wt 169.6 lb

## 2020-09-08 DIAGNOSIS — R6889 Other general symptoms and signs: Secondary | ICD-10-CM

## 2020-09-08 DIAGNOSIS — Z6820 Body mass index (BMI) 20.0-20.9, adult: Secondary | ICD-10-CM | POA: Insufficient documentation

## 2020-09-08 DIAGNOSIS — R634 Abnormal weight loss: Secondary | ICD-10-CM | POA: Insufficient documentation

## 2020-09-08 NOTE — Patient Instructions (Addendum)
   Look into finding easily eaten packaged foods that are high in calories and protein.  Ex. Whole fat yogurt, nut butters, packs of pistachios  Work towards finding ways to get calories in between breakfast and dinner. Even if it is small frequent snacks.  Add protein to your juices or smoothies, or try a protein supplement shake like Ensure, Boost, Muscle Milk, or Premier Protein.  Have a little more gelato!  If you're out to eat, think about whether or not you will eat the leftovers. If you don't think you will, try to eat a bit more while you're out!
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
## 2020-09-08 NOTE — Telephone Encounter (Signed)
Patient called triage asking about his Hepatitis C labs. He was told by nutrition that he had Hepatitis C. Nursing staff tried to explain that he does not have active Hepatitis C but he wanted to speak with me. Called but no answer. Left HIPAA compliant VM. I have discussed his Hep C labs with him on multiple occasions.

## 2020-09-09 ENCOUNTER — Telehealth: Payer: Self-pay | Admitting: Family Medicine

## 2020-09-09 ENCOUNTER — Encounter: Payer: Self-pay | Admitting: Family Medicine

## 2020-09-09 NOTE — Telephone Encounter (Signed)
   Mariano Doshi DOB: [birth year] MRN: [id number]   RIDER WAIVER AND RELEASE OF LIABILITY  For purposes of improving physical access to our facilities, [facility] is pleased to partner with third parties to provide [facility] patients or other authorized individuals the option of convenient, on-demand ground transportation services (the Chiropractor") through use of the technology service that enables users to request on-demand ground transportation from independent third-party providers.  By opting to use and accept these [employer], I, the undersigned, hereby agree on behalf of myself, and on behalf of any minor child using the [employer] for whom I am the parent or legal guardian, as follows:  1. Science writer provided to me are provided by independent third-party transportation providers who are not [employer] or employees and who are unaffiliated with [employer]. 2. [facility] is neither a transportation carrier nor a common or public carrier. 3. [facility] has no control over the quality or safety of the transportation that occurs as a result of the [employer]. 4. [facility] cannot guarantee that any third-party transportation provider will complete any arranged transportation service. 5. [facility] makes no representation, warranty, or guarantee regarding the reliability, timeliness, quality, safety, suitability, or availability of any of the Transport Services or that they will be error free. 6. I fully understand that traveling by vehicle involves risks and dangers of serious bodily injury, including permanent disability, paralysis, and death. I agree, on behalf of myself and on behalf of any minor child using the Transport Services for whom I am the parent or legal guardian, that the entire risk arising out of my use of the [employer] remains solely with me, to the maximum extent permitted under applicable law. 7. The [employer] are provided "as is" and "as available." [facility] disclaims all representations and warranties, express, implied or statutory, not expressly set out in these terms, including the implied warranties of merchantability and fitness for a particular purpose. 8. I hereby waive and release [facility], its agents, employees, officers, directors, representatives, insurers, attorneys, assigns, successors, subsidiaries, and affiliates from any and all past, present, or future claims, demands, liabilities, actions, causes of action, or suits of any kind directly or indirectly arising from acceptance and use of the [employer]. 9. I further waive and release [facility] and its affiliates from all present and future liability and responsibility for any injury or death to persons or damages to property caused by or related to the use of the [employer]. 10. I have read this Waiver and Release of Liability, and I understand the terms used in it and their legal significance. This Waiver is freely and voluntarily given with the understanding that my right (as well as the right of any minor child for whom I am the parent or legal guardian using the [employer]) to legal recourse against [facility] in connection with the [employer] is knowingly surrendered in return for use of these services.   I attest that I read the consent document to Hughie Closs, gave Mr. Denomme the opportunity to ask questions and answered the questions asked (if any). I affirm that Yaasir Menken then provided consent for he's participation in this program.     Ephriam Knuckles Vilsaint

## 2020-09-10 ENCOUNTER — Other Ambulatory Visit: Payer: Self-pay | Admitting: Family Medicine

## 2020-09-10 DIAGNOSIS — F4310 Post-traumatic stress disorder, unspecified: Secondary | ICD-10-CM

## 2020-09-10 DIAGNOSIS — F32A Depression, unspecified: Secondary | ICD-10-CM

## 2020-09-10 DIAGNOSIS — F419 Anxiety disorder, unspecified: Secondary | ICD-10-CM

## 2020-09-14 ENCOUNTER — Other Ambulatory Visit: Payer: Self-pay

## 2020-09-14 ENCOUNTER — Ambulatory Visit: Payer: Self-pay | Attending: Family Medicine | Admitting: Family Medicine

## 2020-09-16 ENCOUNTER — Telehealth: Payer: Self-pay | Admitting: Family Medicine

## 2020-09-16 NOTE — Telephone Encounter (Signed)
Hi Jasmine, This is to notify you of this patient's request via his 'mychart' message from today 09/16/20 not to continue care with the Clinic and for no one to contact him from our Clinic. Thank you.

## 2020-09-20 ENCOUNTER — Encounter (HOSPITAL_COMMUNITY): Payer: Self-pay

## 2020-09-21 ENCOUNTER — Ambulatory Visit (HOSPITAL_COMMUNITY): Payer: No Payment, Other | Admitting: Behavioral Health

## 2020-09-21 ENCOUNTER — Other Ambulatory Visit: Payer: Self-pay

## 2020-09-28 ENCOUNTER — Other Ambulatory Visit: Payer: Self-pay

## 2020-09-28 ENCOUNTER — Encounter: Payer: Self-pay | Admitting: Family Medicine

## 2020-09-28 ENCOUNTER — Ambulatory Visit (HOSPITAL_COMMUNITY): Payer: Medicaid Other | Admitting: Licensed Clinical Social Worker

## 2020-09-28 ENCOUNTER — Encounter (HOSPITAL_COMMUNITY): Payer: Self-pay

## 2020-09-28 ENCOUNTER — Telehealth (HOSPITAL_COMMUNITY): Payer: No Payment, Other | Admitting: Physician Assistant

## 2020-09-30 ENCOUNTER — Ambulatory Visit (HOSPITAL_COMMUNITY): Payer: No Payment, Other | Admitting: Behavioral Health

## 2020-10-06 ENCOUNTER — Encounter: Payer: Self-pay | Admitting: Family Medicine

## 2020-10-07 ENCOUNTER — Ambulatory Visit
Admission: EM | Admit: 2020-10-07 | Discharge: 2020-10-07 | Disposition: A | Discharge status: Home / Self Care | Payer: Medicaid Other

## 2020-10-07 ENCOUNTER — Other Ambulatory Visit: Payer: Self-pay

## 2020-10-07 DIAGNOSIS — F411 Generalized anxiety disorder: Secondary | ICD-10-CM

## 2020-10-07 DIAGNOSIS — M79602 Pain in left arm: Secondary | ICD-10-CM

## 2020-10-07 NOTE — Discharge Instructions (Signed)
For acute anxiety attacks ok to continue Hydroxyzine at bedtime. Keep follow-up at the [facility] health mental health counseling center. If you experience any acute anxiety or depressive symptoms or thoughts of self-harm go immediately to the [facility] health mental health 24-hour urgent care center.

## 2020-10-07 NOTE — ED Provider Notes (Signed)
[name]    CSN: [id number] Arrival date & time: 10/07/20  1228      History   Chief Complaint Chief Complaint  Patient presents with  . Arm Pain  . Hypertension    HPI Marc Sandoval is a 30 y.o. male.   HPI Patient presents today with left arm pain x one day, concern with elevated blood pressure, and anxiety. He admits recently falling asleep on the cough and is unaware if this preciptated left arm bicep pain.  He has not taken any medication to improve pain. He has full ROM of lUE.No dizziness, weakness, or paresthesia involving the LUE.  He reports that he has a history of severe anxiety, PTSD and admits that he has recently experienced anxiety attacks following reviewing social media and knowledge of two celebrities committing suicide that are very close in age to patient. These events precipitated an increase of underlying anxiety and he is here today for evaluation and reassurance that he is ok. He occasionally experiences sharp chest pain during periods of increased anxiety. He was prescribed Wellbutrin which he discontinued due to recurrent headaches and medicine generally caused him to feel poorly. He is also prescribed hydroxyzine which induced drowsiness, but he felt medication was somewhat beneficial in managing panic attacks. He is interested in obtaining mental health resources. He endorses occasional thoughts of being better off dead but no overt desire or plan to commit suicide or preform self harm behaviors. Past Medical History:  Diagnosis Date  . Anxiety   . Depression   . Hypertension   . Migraines     Patient Active Problem List   Diagnosis Date Noted  . Major depressive disorder, recurrent episode, moderate (HCC) 09/06/2020  . History of substance abuse (HCC) 09/06/2020  . Panic attack as reaction to stress 09/06/2020  . Vitamin D deficiency 02/06/2020  . Closed fracture of left ankle 10/13/2018    Past Surgical History:  Procedure  Laterality Date  . skin graft on back         Home Medications    Prior to Admission medications   Medication Sig Start Date End Date Taking? Authorizing Provider  buPROPion (WELLBUTRIN SR) 150 MG 12 hr tablet Take 1 tablet (150 mg total) by mouth 2 (two) times daily. Patient not taking: No sig reported 09/01/20   Hoy Register, MD  emtricitabine-tenofovir AF (DESCOVY) 200-25 MG tablet Take 1 tablet by mouth daily. 08/04/20   Kuppelweiser, Cassie L, RPH-CPP  hydrOXYzine (ATARAX/VISTARIL) 50 MG tablet Take 1 tablet (50 mg total) by mouth every 12 (twelve) hours as needed. 09/01/20   Hoy Register, MD  Vitamin D, Ergocalciferol, (DRISDOL) 1.25 MG (50000 UNIT) CAPS capsule Take 1 capsule (50,000 Units total) by mouth every 7 (seven) days. Patient not taking: No sig reported 02/06/20   Arvilla Market, DO  dicyclomine (BENTYL) 20 MG tablet Take 1 tablet (20 mg total) by mouth 2 (two) times daily. 06/20/19 01/31/20  Belinda Fisher, PA-C    Family History Family History  Problem Relation Age of Onset  . Healthy Mother   . Healthy Daughter   . Stroke Neg Hx     Social History Social History   Tobacco Use  . Smoking status: Never Smoker  . Smokeless tobacco: Never Used  Vaping Use  . Vaping Use: Never used  Substance Use Topics  . Alcohol use: Yes    Comment: occ  . Drug use: Yes    Types: Marijuana  Allergies   Dayquil [pseudoephedrine-apap-dm], Doxylamine-phenylephrine-apap, Doxylamine-phenylephrine-apap, Nyquil hbp cold & flu [dm-doxylamine-acetaminophen], and Vancomycin   Review of Systems Review of Systems Pertinent negatives listed in HPI Physical Exam Triage Vital Signs ED Triage Vitals  Enc Vitals Group     BP 10/07/20 1326 137/72     Pulse Rate 10/07/20 1326 69     Resp 10/07/20 1326 20     Temp 10/07/20 1326 98.6 F (37 C)     Temp Source 10/07/20 1326 Oral     SpO2 10/07/20 1326 98 %     Weight 10/07/20 1345 166 lb (75.3 kg)     Height --       Head Circumference --      Peak Flow --      Pain Score 10/07/20 1344 4     Pain Loc --      Pain Edu? --      Excl. in GC? --    No data found.  Updated Vital Signs BP 137/72 (BP Location: Left Arm)   Pulse 69   Temp 98.6 F (37 C) (Oral)   Resp 20   Wt 166 lb (75.3 kg)   SpO2 98%   BMI 20.21 kg/m   Visual Acuity Right Eye Distance:   Left Eye Distance:   Bilateral Distance:    Right Eye Near:   Left Eye Near:    Bilateral Near:     Physical Exam Physical Exam: Constitutional: Patient appears well-developed and well-nourished. No distress. HENT: Normocephalic, atraumatic, External right and left ear normal.  Eyes: Conjunctivae and EOM are normal. PERRLA, no scleral icterus. Neck: Normal ROM. Neck supple. No JVD. No tracheal deviation. No thyromegaly. CVS: RRR, S1/S2 +, no murmurs, no gallops, no carotid bruit.  Pulmonary: Effort and breath sounds normal, no stridor, rhonchi, wheezes, rales.  Neuro: Alert. Normal reflexes, muscle tone coordination. No cranial nerve deficit. Skin: Skin is warm and dry. No rash noted. Not diaphoretic. No erythema. No pallor. Psychiatric: Anxious mood, normal affect. Behavior, judgment, thought content normal.  UC Treatments / Results  Labs (all labs ordered are listed, but only abnormal results are displayed) Labs Reviewed - No data to display  EKG   Radiology No results found.  Procedures Procedures (including critical care time)  Medications Ordered in UC Medications - No data to display  Initial Impression / Assessment and Plan / UC Course  I have reviewed the triage vital signs and the nursing notes.  Pertinent labs & imaging results that were available during my care of the patient were reviewed by me and considered in my medical decision making (see chart for details).    Patient present today with panic and anxiety related to recent trigger of two well known celebrities committing suicide. Patient had attempted to  get establish with [facility] center and agrees to contact their office today obtain an appointment.  Discussed the availability of [facility] urgent care services if he develops any severe symptoms or panic that he feels warrant immediate evaluation he was advised to go to [facility] UC.  Patient denies any plan for self harm or harm to others. Stable for discharge. Final Clinical Impressions(s) / UC Diagnoses   Final diagnoses:  Left arm pain  Anxiety state     Discharge Instructions     For acute anxiety attacks ok to continue Hydroxyzine at bedtime. Keep follow-up at the [facility] health mental health counseling center. If you experience any acute anxiety or depressive  symptoms or thoughts of self-harm go immediately to the [facility] health mental health 24-hour urgent care center.     ED Prescriptions    None     PDMP not reviewed this encounter.   [name], FNP 10/08/20 [phone]

## 2020-10-07 NOTE — ED Triage Notes (Signed)
Patient presents to Urgent Care with complaints of left arm pain x 1 day, concerned with possible elevated BP, and chest discomfort. Pt states he has a hx of Anxiety and currently not on any meds for Anxiety.  Describes arm pain a 4/10 aching pain. He reports pain starts at lower arm area and radiates to shoulder and back down to lower arm. Reports increased panic attacks within the past week. Pt states he has not established care for mental health.    Denies any changes to activity.

## 2020-10-15 ENCOUNTER — Ambulatory Visit (INDEPENDENT_AMBULATORY_CARE_PROVIDER_SITE_OTHER): Payer: No Payment, Other | Admitting: Physician Assistant

## 2020-10-15 ENCOUNTER — Other Ambulatory Visit: Payer: Self-pay

## 2020-10-15 ENCOUNTER — Encounter (HOSPITAL_COMMUNITY): Payer: Self-pay | Admitting: Physician Assistant

## 2020-10-15 ENCOUNTER — Other Ambulatory Visit (HOSPITAL_COMMUNITY): Payer: Self-pay | Admitting: Physician Assistant

## 2020-10-15 VITALS — BP 128/87 | HR 62 | Ht 76.0 in | Wt 169.0 lb

## 2020-10-15 DIAGNOSIS — F332 Major depressive disorder, recurrent severe without psychotic features: Secondary | ICD-10-CM

## 2020-10-15 DIAGNOSIS — F4310 Post-traumatic stress disorder, unspecified: Secondary | ICD-10-CM | POA: Diagnosis not present

## 2020-10-15 DIAGNOSIS — F410 Panic disorder [episodic paroxysmal anxiety] without agoraphobia: Secondary | ICD-10-CM

## 2020-10-15 DIAGNOSIS — F411 Generalized anxiety disorder: Secondary | ICD-10-CM

## 2020-10-15 DIAGNOSIS — F430 Acute stress reaction: Secondary | ICD-10-CM

## 2020-10-15 MED ORDER — FLUOXETINE HCL 20 MG PO CAPS: 20.0000 mg | ORAL_CAPSULE | Freq: Every day | ORAL | 1 refills | Status: DC

## 2020-10-15 MED ORDER — BUSPIRONE HCL 5 MG PO TABS: 5.0000 mg | ORAL_TABLET | Freq: Two times a day (BID) | ORAL | 1 refills | Status: DC

## 2020-10-15 MED FILL — FLUoxetine HCL 20 MG CAPS: 20 | 30 days supply | Qty: 30 | Fill #0

## 2020-10-15 MED FILL — busPIRone HCL 5 MG TABS: 5 | 30 days supply | Qty: 60 | Fill #0
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
## 2020-10-16 ENCOUNTER — Other Ambulatory Visit: Payer: Self-pay

## 2020-10-16 DIAGNOSIS — E559 Vitamin D deficiency, unspecified: Secondary | ICD-10-CM

## 2020-10-24 ENCOUNTER — Other Ambulatory Visit: Payer: Self-pay

## 2020-10-24 ENCOUNTER — Ambulatory Visit
Admission: EM | Admit: 2020-10-24 | Discharge: 2020-10-24 | Disposition: A | Discharge status: Home / Self Care | Payer: Medicaid Other | Attending: Emergency Medicine | Admitting: Emergency Medicine

## 2020-10-24 ENCOUNTER — Encounter: Payer: Self-pay | Admitting: Emergency Medicine

## 2020-10-24 DIAGNOSIS — F411 Generalized anxiety disorder: Secondary | ICD-10-CM | POA: Insufficient documentation

## 2020-10-24 DIAGNOSIS — R1030 Lower abdominal pain, unspecified: Secondary | ICD-10-CM | POA: Insufficient documentation

## 2020-10-24 DIAGNOSIS — Z79899 Other long term (current) drug therapy: Secondary | ICD-10-CM | POA: Insufficient documentation

## 2020-10-24 DIAGNOSIS — T7421XA Adult sexual abuse, confirmed, initial encounter: Secondary | ICD-10-CM

## 2020-10-24 DIAGNOSIS — Z113 Encounter for screening for infections with a predominantly sexual mode of transmission: Secondary | ICD-10-CM | POA: Insufficient documentation

## 2020-10-24 DIAGNOSIS — F32A Depression, unspecified: Secondary | ICD-10-CM | POA: Insufficient documentation

## 2020-10-24 MED ORDER — ALPRAZOLAM 0.25 MG PO TABS: 0.2500 mg | ORAL_TABLET | Freq: Every evening | ORAL | 0 refills | Status: DC | PRN
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
## 2020-10-24 NOTE — Discharge Instructions (Signed)
STD screening pending May use Xanax at bedtime as needed for severe anxiety Please go to [facility] if developing any thoughts of harming yourself Please follow-up with primary care for further monitoring and management of anxiety and depression

## 2020-10-25 ENCOUNTER — Encounter: Payer: Self-pay | Admitting: Family Medicine

## 2020-10-26 LAB — RPR: RPR Ser Ql: NONREACTIVE

## 2020-10-26 LAB — HIV ANTIBODY (ROUTINE TESTING W REFLEX): HIV Screen 4th Generation wRfx: NONREACTIVE

## 2020-10-27 LAB — CYTOLOGY, (ORAL, ANAL, URETHRAL) ANCILLARY ONLY
Chlamydia: NEGATIVE
Comment: NEGATIVE
Comment: NEGATIVE
Comment: NORMAL
Neisseria Gonorrhea: NEGATIVE
Trichomonas: NEGATIVE

## 2020-11-02 ENCOUNTER — Other Ambulatory Visit: Payer: Self-pay

## 2020-11-02 ENCOUNTER — Other Ambulatory Visit: Payer: Self-pay | Admitting: Family Medicine

## 2020-11-02 DIAGNOSIS — Z79899 Other long term (current) drug therapy: Secondary | ICD-10-CM

## 2020-11-03 LAB — HIV ANTIBODY (ROUTINE TESTING W REFLEX): HIV 1&2 Ab, 4th Generation: NONREACTIVE

## 2020-11-10 ENCOUNTER — Encounter: Payer: Medicaid Other | Admitting: Dietician

## 2020-11-14 IMAGING — DX DG THORACIC SPINE 2V
3 series · 3 of 3 positions shown · non-contrast
Comparison: None.

CLINICAL DATA: Thoracic spine pain.

EXAM:
THORACIC SPINE 2 VIEWS

[t-spine ap]
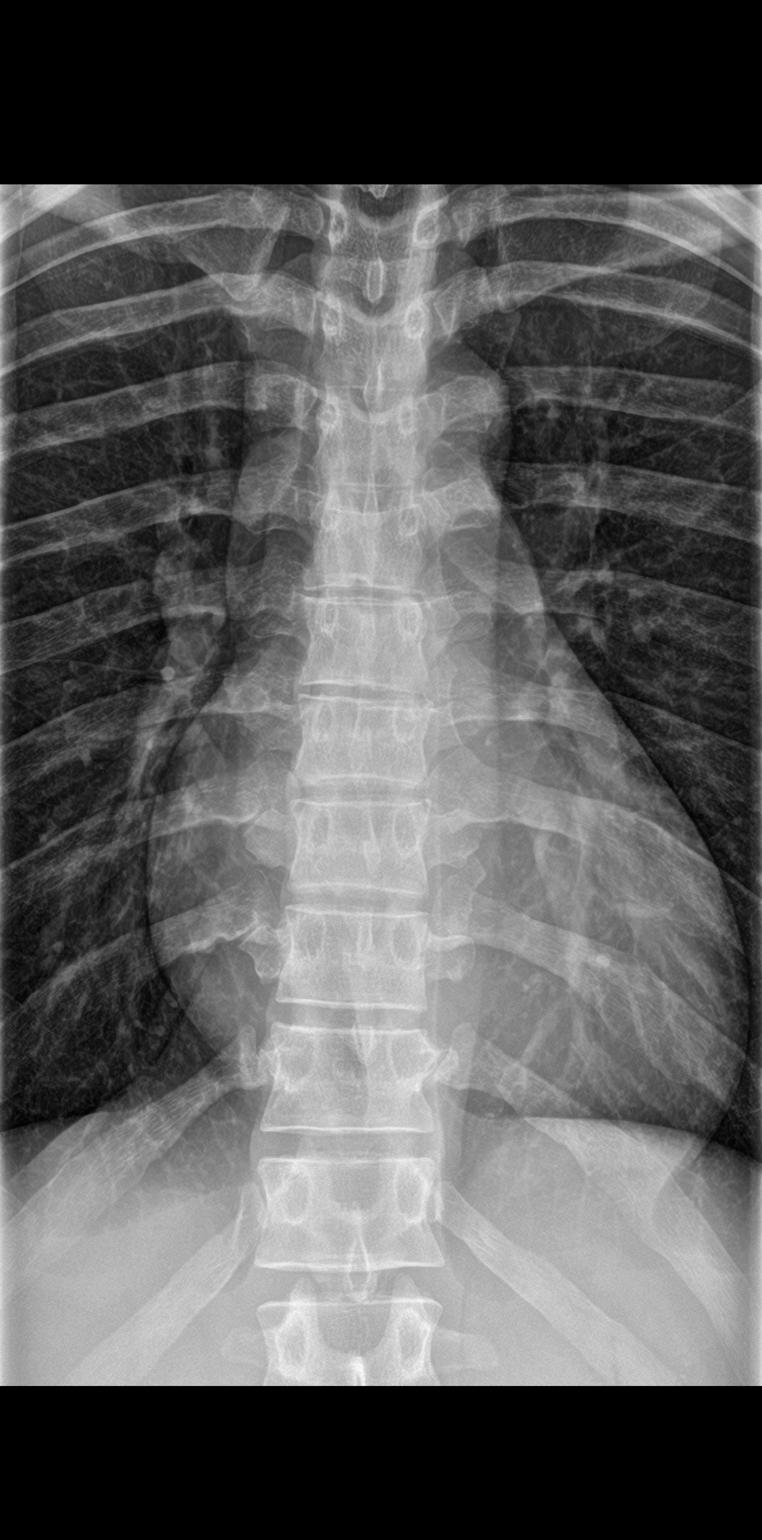

[t-spine lat]
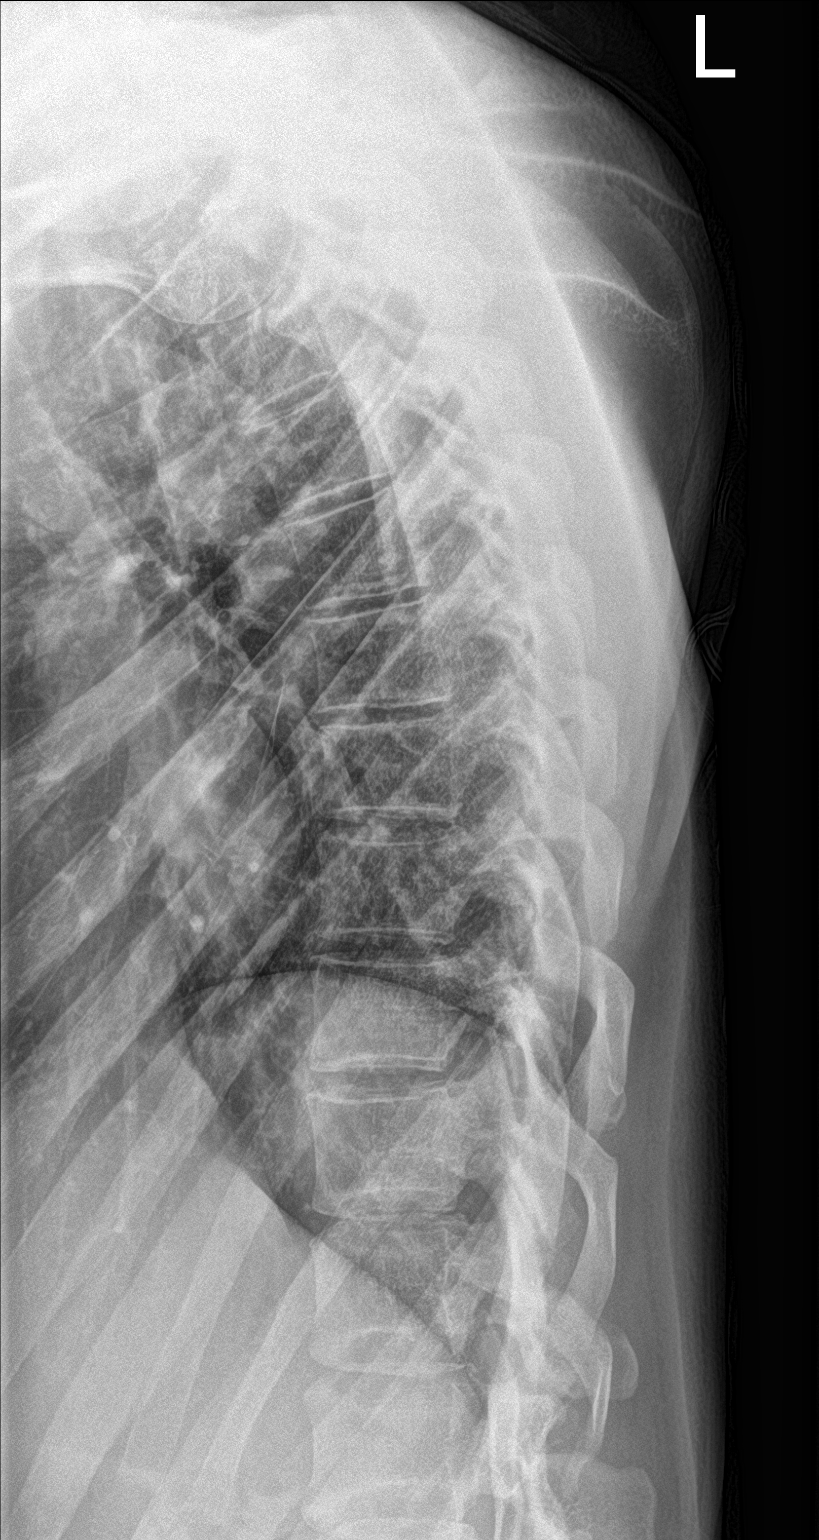

[t-spine swimmers]
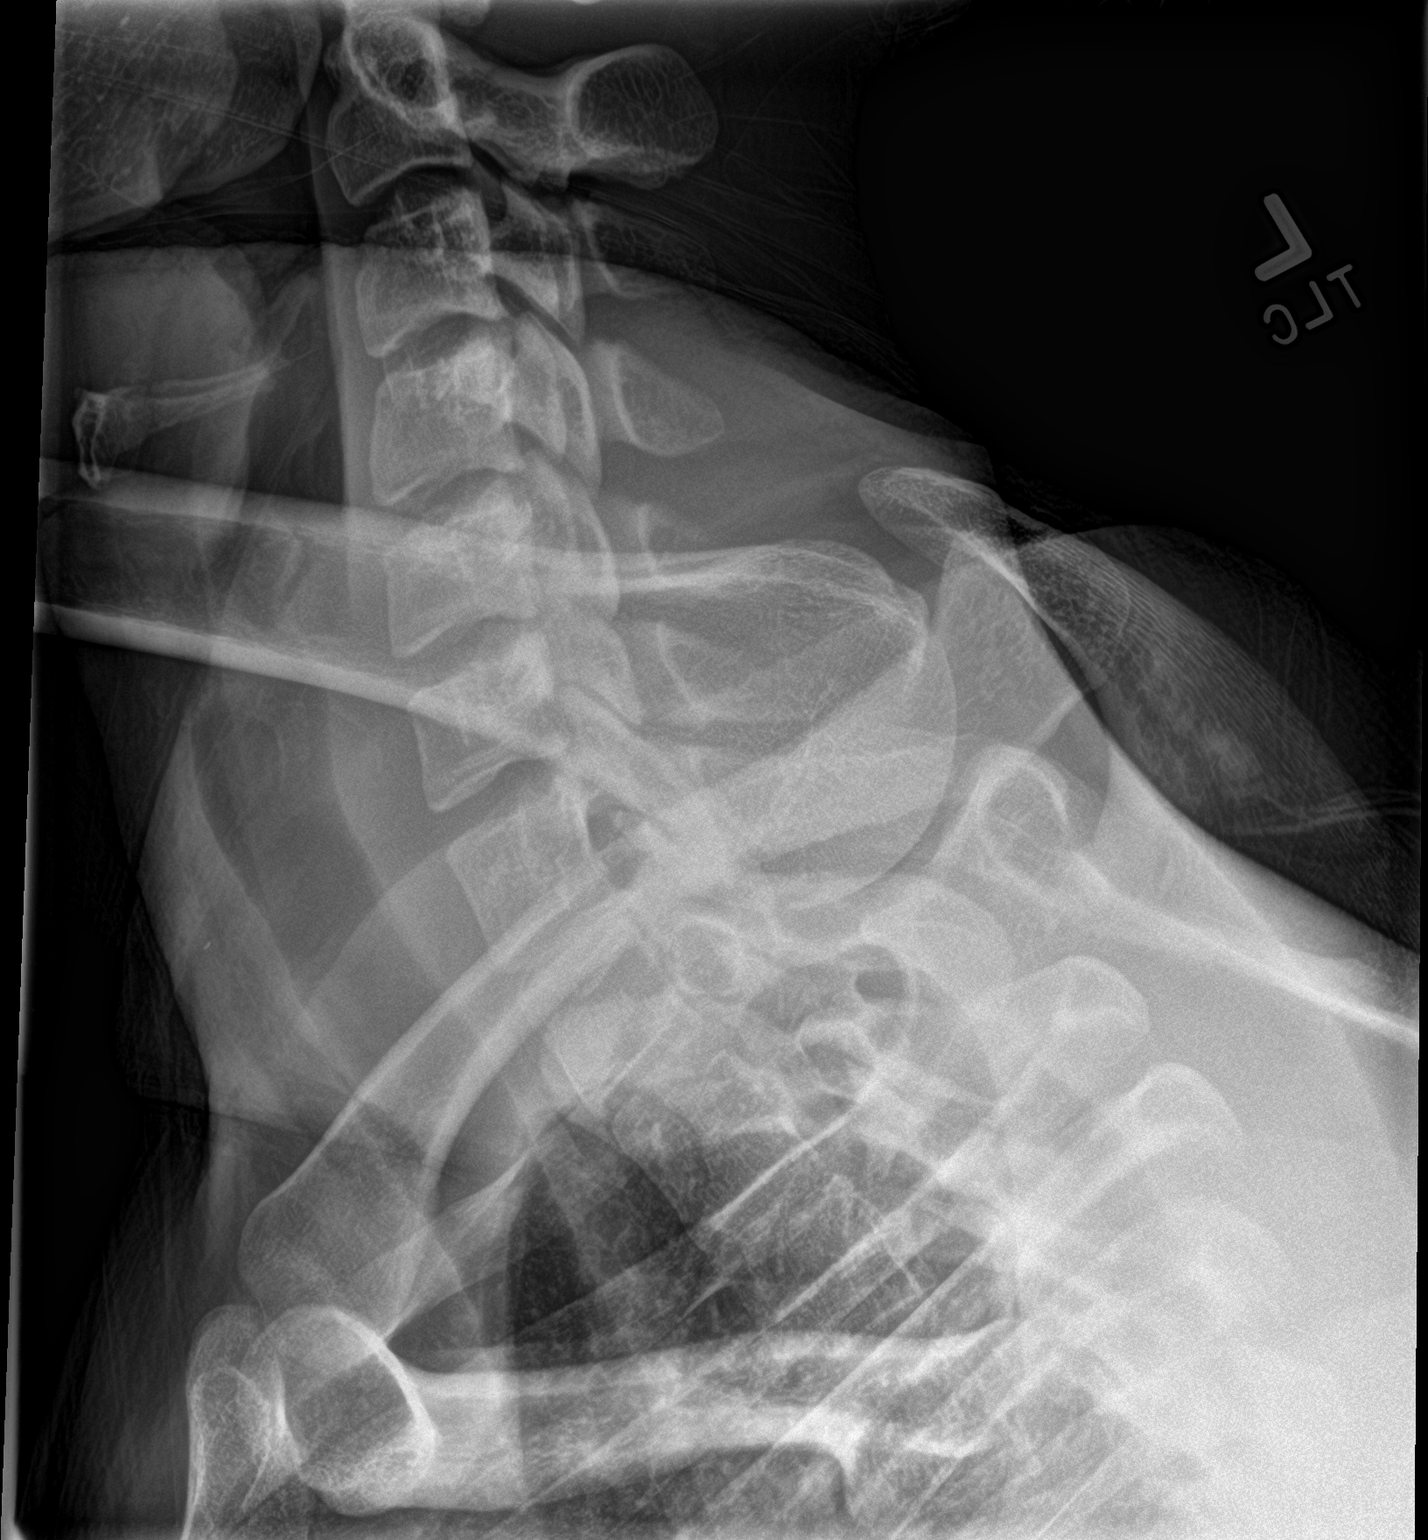

[3 of 3 positions shown; findings below may reference images not displayed]

FINDINGS: There is no evidence of thoracic spine fracture. Alignment is
normal. No other significant bone abnormalities are identified.
IMPRESSION: Negative.

## 2020-11-15 IMAGING — CR DG CHEST 2V
2 series · 2 of 2 positions shown · non-contrast
Comparison: None.

CLINICAL DATA: Chest pain

EXAM:
CHEST - 2 VIEW

[chest pa]
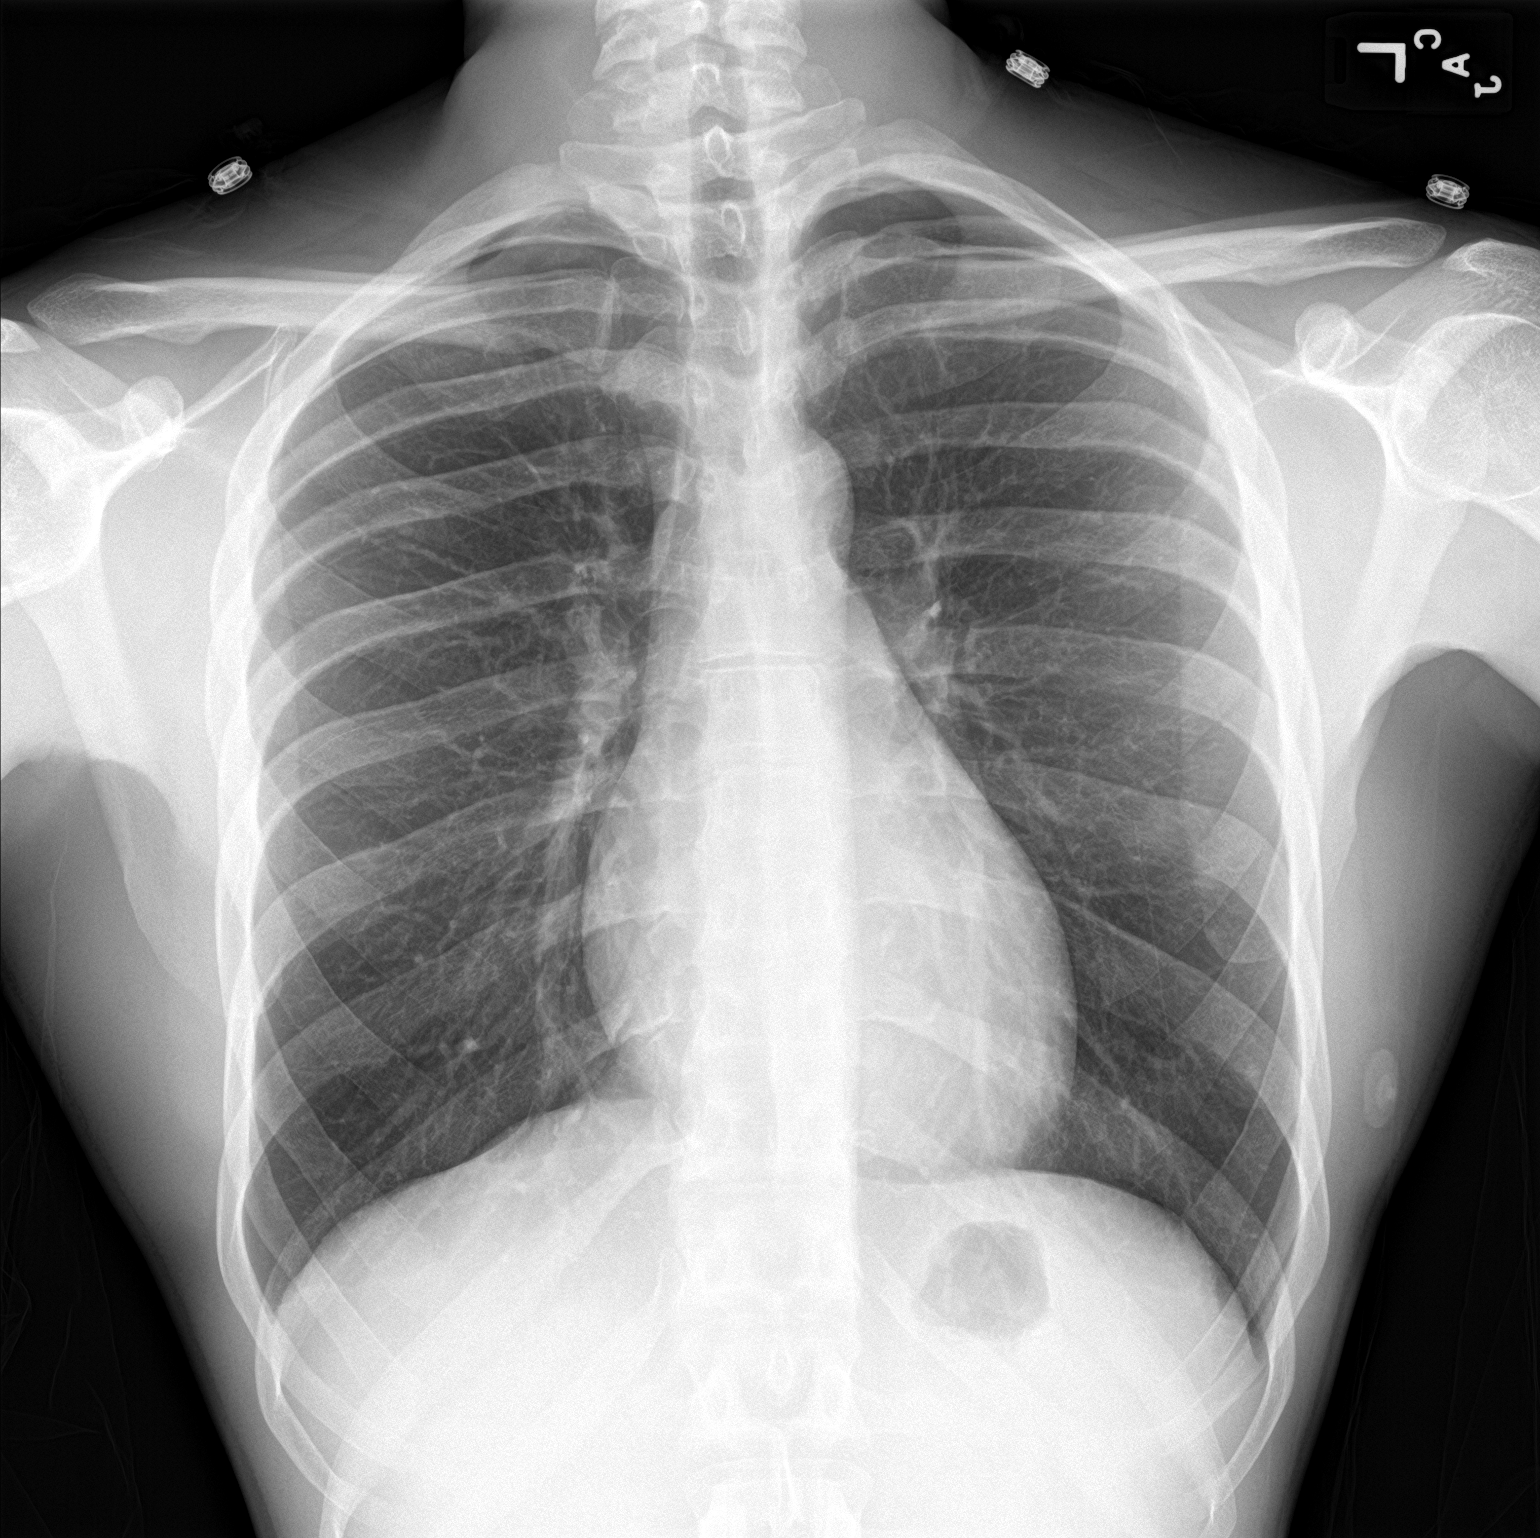

[chest lat]
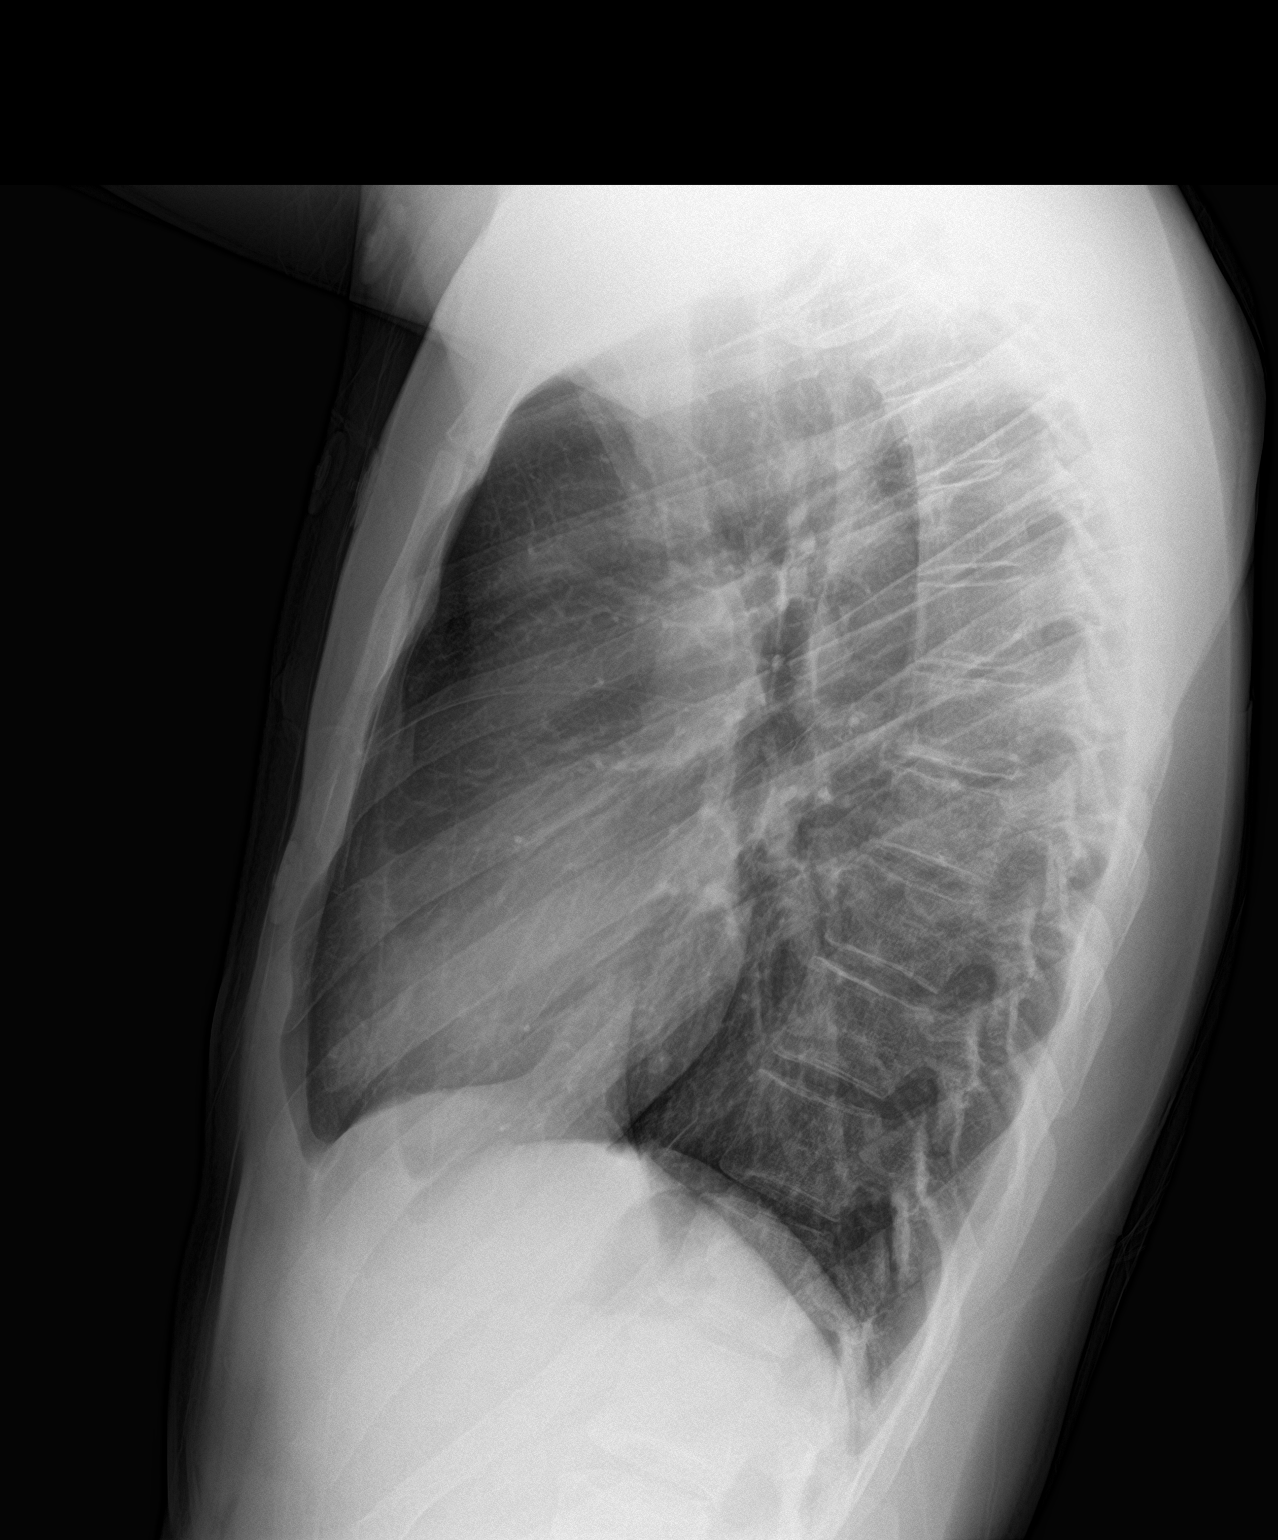

[2 of 2 positions shown; findings below may reference images not displayed]

FINDINGS: The heart size and mediastinal contours are within normal limits.
Both lungs are clear. The visualized skeletal structures are
unremarkable.
IMPRESSION: No active cardiopulmonary disease.

## 2020-11-24 ENCOUNTER — Telehealth (HOSPITAL_COMMUNITY): Payer: No Payment, Other | Admitting: Physician Assistant

## 2020-11-30 ENCOUNTER — Other Ambulatory Visit (HOSPITAL_COMMUNITY): Payer: Self-pay

## 2020-12-16 ENCOUNTER — Other Ambulatory Visit (HOSPITAL_COMMUNITY): Payer: Self-pay

## 2020-12-31 ENCOUNTER — Encounter: Payer: Self-pay | Admitting: Family Medicine

## 2021-01-03 ENCOUNTER — Ambulatory Visit
Admission: EM | Admit: 2021-01-03 | Discharge: 2021-01-03 | Disposition: A | Discharge status: Home / Self Care | Payer: Medicaid Other | Attending: Emergency Medicine | Admitting: Emergency Medicine

## 2021-01-03 ENCOUNTER — Encounter: Payer: Self-pay | Admitting: Emergency Medicine

## 2021-01-03 ENCOUNTER — Other Ambulatory Visit: Payer: Self-pay

## 2021-01-03 DIAGNOSIS — F411 Generalized anxiety disorder: Secondary | ICD-10-CM

## 2021-01-03 DIAGNOSIS — J302 Other seasonal allergic rhinitis: Secondary | ICD-10-CM

## 2021-01-03 MED ORDER — NAPROXEN 500 MG PO TABS
500.0000 mg | ORAL_TABLET | Freq: Two times a day (BID) | ORAL | 0 refills | Status: DC
  Filled 2021-01-03: qty 30, 15d supply, fill #0

## 2021-01-03 MED ORDER — FLUTICASONE PROPIONATE 50 MCG/ACT NA SUSP
1.0000 | Freq: Every day | NASAL | 0 refills | Status: DC
  Filled 2021-01-03: qty 16, 30d supply, fill #0

## 2021-01-03 MED ORDER — ALPRAZOLAM 0.25 MG PO TABS
0.2500 mg | ORAL_TABLET | Freq: Every evening | ORAL | 0 refills | Status: DC | PRN
  Filled 2021-01-03: qty 6, 6d supply, fill #0
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
## 2021-01-03 NOTE — Discharge Instructions (Signed)
Please establish care with primary care- contact below Add flonase to zyrtec for allergies Naprosyn as needed for pain Xanax for panic attacks/severe anxiety Follow up if not improving or worsening

## 2021-01-04 ENCOUNTER — Other Ambulatory Visit (HOSPITAL_COMMUNITY): Payer: Self-pay

## 2021-01-04 NOTE — ED Provider Notes (Signed)
[name]    CSN: [id number] Arrival date & time: 01/03/21  1845      History   Chief Complaint Chief Complaint  Patient presents with  . Anxiety    HPI Marc Sandoval is a 30 y.o. male history of anxiety, depression, migraines, presenting today for evaluation of panic attack.  Reports earlier today began to develop chest discomfort, nausea, cold sweats.  Reports he has had increased anxiety and stress especially in his workplace.  He expresses multiple concerns regarding discrimination towards him and congestion treatment.  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  He has tried multiple anxiety medicines, but does not have a PCP.  He reports previous thoughts of SI, but denies at current.  Also reports allergies flared, using Zyrtec daily   HPI  Past Medical History:  Diagnosis Date  . Anxiety   . Depression   . Hypertension   . Migraines     Patient Active Problem List   Diagnosis Date Noted  . Generalized anxiety disorder 10/15/2020  . PTSD (post-traumatic stress disorder) 10/15/2020  . Major depressive disorder, recurrent episode, moderate (HCC) 09/06/2020  . History of substance abuse (HCC) 09/06/2020  . Panic attack as reaction to stress 09/06/2020  . Vitamin D deficiency 02/06/2020  . Closed fracture of left ankle 10/13/2018    Past Surgical History:  Procedure Laterality Date  . skin graft on back         Home Medications    Prior to Admission medications   Medication Sig Start Date End Date Taking? Authorizing Provider  fluticasone (FLONASE) 50 MCG/ACT nasal spray Place 1-2 sprays into both nostrils daily. 01/03/21  Yes Frederich Montilla C, PA-C  naproxen (NAPROSYN) 500 MG tablet Take 1 tablet (500 mg total) by mouth 2 (two) times daily. 01/03/21  Yes Osmin Welz C, PA-C  ALPRAZolam (XANAX) 0.25 MG  tablet Take 1 tablet (0.25 mg total) by mouth at bedtime as needed for anxiety. 01/03/21   Deontray Hunnicutt C, PA-C  buPROPion (WELLBUTRIN SR) 150 MG 12 hr tablet TAKE 1 TABLET (150 MG TOTAL) BY MOUTH 2 (TWO) TIMES DAILY. Patient not taking: No sig reported 09/01/20 09/01/21  Hoy Register, MD  busPIRone (BUSPAR) 5 MG tablet TAKE 1 TABLET BY MOUTH 2 TIMES DAILY Patient not taking: Reported on 01/03/2021 10/15/20 10/15/21  Karel Jarvis E, PA  FLUoxetine (PROZAC) 20 MG capsule TAKE 1 CAPSULE BY MOUTH ONCE A DAY Patient not taking: Reported on 01/03/2021 10/15/20 10/15/21  Karel Jarvis E, PA  hydrOXYzine (ATARAX/VISTARIL) 50 MG tablet TAKE 1 TABLET (50 MG TOTAL) BY MOUTH EVERY 12 (TWELVE) HOURS AS NEEDED. Patient not taking: Reported on 01/03/2021 09/01/20 09/01/21  Hoy Register, MD  dicyclomine (BENTYL) 20 MG tablet Take 1 tablet (20 mg total) by mouth 2 (two) times daily. 06/20/19 01/31/20  Cathie Hoops, Amy V, PA-C  PARoxetine (PAXIL) 20 MG tablet Take 1 tablet (20 mg total) by mouth daily. 08/04/20 08/31/20  Hoy Register, MD    Family History Family History  Problem Relation Age of Onset  . Healthy Mother   . Healthy Daughter   . Stroke Neg Hx     Social History Social History   Tobacco Use  . Smoking status: Never Smoker  . Smokeless tobacco: Never Used  Vaping Use  . Vaping Use: Never used  Substance Use Topics  .  Alcohol use: Not Currently    Comment: occ  . Drug use: Not Currently    Types: Marijuana     Allergies   Dayquil [pseudoephedrine-apap-dm], Doxylamine-phenylephrine-apap, Doxylamine-phenylephrine-apap, Nyquil hbp cold & flu [dm-doxylamine-acetaminophen], and Vancomycin   Review of Systems Review of Systems  Constitutional: Negative for fatigue and fever.  HENT: Positive for congestion and rhinorrhea. Negative for sinus pressure and sore throat.   Eyes: Negative for photophobia, pain and visual disturbance.  Respiratory: Negative for cough and shortness of breath.    Cardiovascular: Negative for chest pain.  Gastrointestinal: Negative for abdominal pain, nausea and vomiting.  Genitourinary: Negative for decreased urine volume and hematuria.  Musculoskeletal: Positive for arthralgias. Negative for myalgias, neck pain and neck stiffness.  Neurological: Positive for headaches. Negative for dizziness, syncope, facial asymmetry, speech difficulty, weakness, light-headedness and numbness.     Physical Exam Triage Vital Signs ED Triage Vitals  Enc Vitals Group     BP 01/03/21 2053 (!) 151/94     Pulse Rate 01/03/21 2053 68     Resp 01/03/21 2053 20     Temp 01/03/21 2053 98.6 F (37 C)     Temp Source 01/03/21 2053 Oral     SpO2 01/03/21 2053 98 %     Weight --      Height --      Head Circumference --      Peak Flow --      Pain Score 01/03/21 2049 10     Pain Loc --      Pain Edu? --      Excl. in GC? --    No data found.  Updated Vital Signs BP (!) 151/94 (BP Location: Left Arm)   Pulse 68   Temp 98.6 F (37 C) (Oral)   Resp 20   SpO2 98%   Visual Acuity Right Eye Distance:   Left Eye Distance:   Bilateral Distance:    Right Eye Near:   Left Eye Near:    Bilateral Near:     Physical Exam Vitals and nursing note reviewed.  Constitutional:      Appearance: He is well-developed.     Comments: No acute distress  HENT:     Head: Normocephalic and atraumatic.     Nose: Nose normal.  Eyes:     Conjunctiva/sclera: Conjunctivae normal.  Cardiovascular:     Rate and Rhythm: Normal rate.  Pulmonary:     Effort: Pulmonary effort is normal. No respiratory distress.  Abdominal:     General: There is no distension.  Musculoskeletal:        General: Normal range of motion.     Cervical back: Neck supple.  Skin:    General: Skin is warm and dry.  Neurological:     Mental Status: He is alert and oriented to person, place, and time.      UC Treatments / Results  Labs (all labs ordered are listed, but only abnormal results  are displayed) Labs Reviewed - No data to display  EKG   Radiology No results found.  Procedures Procedures (including critical care time)  Medications Ordered in UC Medications - No data to display  Initial Impression / Assessment and Plan / UC Course  I have reviewed the triage vital signs and the nursing notes.  Pertinent labs & imaging results that were available during my care of the patient were reviewed by me and considered in my medical decision making (see chart for details).  1.  Anxiety-did provide 6 tablets of Xanax at this is previously helped him to get some rest, providing a few days off from work, encouraged establishing care with primary care again, provided contact.  2.  Allergic rhinitis- add in Flonase to Zyrtec  3.  Arthralgias-Naprosyn as needed  Discussed strict return precautions. Patient verbalized understanding and is agreeable with plan.  45 minutes was spent in room with direct patient facing contact  Final Clinical Impressions(s) / UC Diagnoses   Final diagnoses:  Seasonal allergic rhinitis, unspecified trigger  Generalized anxiety disorder     Discharge Instructions     Please establish care with primary care- contact below Add flonase to zyrtec for allergies Naprosyn as needed for pain Xanax for panic attacks/severe anxiety Follow up if not improving or worsening   ED Prescriptions    Medication Sig Dispense Auth. Provider   fluticasone (FLONASE) 50 MCG/ACT nasal spray Place 1-2 sprays into both nostrils daily. 16 g Savannha Welle C, PA-C   ALPRAZolam (XANAX) 0.25 MG tablet Take 1 tablet (0.25 mg total) by mouth at bedtime as needed for anxiety. 6 tablet Alixandria Friedt C, PA-C   naproxen (NAPROSYN) 500 MG tablet Take 1 tablet (500 mg total) by mouth 2 (two) times daily. 30 tablet Alean Kromer, [place] C, PA-C     I have reviewed the PDMP during this encounter.   Sharyon Cable [place] C, [place] 01/04/21 [phone]

## 2021-01-05 ENCOUNTER — Other Ambulatory Visit (HOSPITAL_COMMUNITY): Payer: Self-pay

## 2021-01-14 ENCOUNTER — Encounter: Payer: Self-pay | Admitting: Family Medicine

## 2021-02-02 ENCOUNTER — Ambulatory Visit: Payer: Self-pay | Admitting: Pharmacist

## 2021-02-07 ENCOUNTER — Telehealth: Payer: Self-pay

## 2021-02-07 NOTE — Telephone Encounter (Signed)
Called patient to get missed appointment rescheduled, left a voicemail for patient to call back and get scheduled.. 

## 2021-02-10 ENCOUNTER — Ambulatory Visit (INDEPENDENT_AMBULATORY_CARE_PROVIDER_SITE_OTHER): Payer: Self-pay | Admitting: Pharmacist

## 2021-02-10 ENCOUNTER — Other Ambulatory Visit: Payer: Self-pay

## 2021-02-10 DIAGNOSIS — Z113 Encounter for screening for infections with a predominantly sexual mode of transmission: Secondary | ICD-10-CM

## 2021-02-10 DIAGNOSIS — Z79899 Other long term (current) drug therapy: Secondary | ICD-10-CM

## 2021-02-10 NOTE — Progress Notes (Signed)
Date:  02/12/2021   HPI: Marc Sandoval is a 30 y.o. male who presents to the [facility] pharmacy clinic for HIV PrEP follow-up.  Insured   []    Uninsured  [x]    Patient Active Problem List   Diagnosis Date Noted   Generalized anxiety disorder 10/15/2020   PTSD (post-traumatic stress disorder) 10/15/2020   Major depressive disorder, recurrent episode, moderate (HCC) 09/06/2020   History of substance abuse (HCC) 09/06/2020   Panic attack as reaction to stress 09/06/2020   Vitamin D deficiency 02/06/2020   Closed fracture of left ankle 10/13/2018    Patient's Medications  New Prescriptions   EMTRICITABINE-TENOFOVIR AF (DESCOVY) 200-25 MG TABLET    Take 1 tablet by mouth daily.  Previous Medications   ALPRAZOLAM (XANAX) 0.25 MG TABLET    Take 1 tablet (0.25 mg total) by mouth at bedtime as needed for anxiety.   BUPROPION (WELLBUTRIN SR) 150 MG 12 HR TABLET    TAKE 1 TABLET (150 MG TOTAL) BY MOUTH 2 (TWO) TIMES DAILY.   BUSPIRONE (BUSPAR) 5 MG TABLET    TAKE 1 TABLET BY MOUTH 2 TIMES DAILY   FLUOXETINE (PROZAC) 20 MG CAPSULE    TAKE 1 CAPSULE BY MOUTH ONCE A DAY   FLUTICASONE (FLONASE) 50 MCG/ACT NASAL SPRAY    Place 1-2 sprays into both nostrils daily.   HYDROXYZINE (ATARAX/VISTARIL) 50 MG TABLET    TAKE 1 TABLET (50 MG TOTAL) BY MOUTH EVERY 12 (TWELVE) HOURS AS NEEDED.   NAPROXEN (NAPROSYN) 500 MG TABLET    Take 1 tablet (500 mg total) by mouth 2 (two) times daily.  Modified Medications   No medications on file  Discontinued Medications   No medications on file    Allergies: Allergies  Allergen Reactions   Dayquil [Pseudoephedrine-Apap-Dm] Hives    Reports cause hives, sweats, cold symptoms   Doxylamine-Phenylephrine-Apap Hives   Doxylamine-Phenylephrine-Apap Hives   Nyquil Hbp Cold & Flu [Dm-Doxylamine-Acetaminophen] Hives    Reports cause hives, sweats, cold symptoms   Vancomycin     Past Medical History: Past Medical History:  Diagnosis Date   Anxiety    Depression     Hypertension    Migraines    PTSD (post-traumatic stress disorder)     Social History: Social History   Socioeconomic History   Marital status: Single    Spouse name: Not on file   Number of children: Not on file   Years of education: Not on file   Highest education level: Not on file  Occupational History   Not on file  Tobacco Use   Smoking status: Never   Smokeless tobacco: Never  Vaping Use   Vaping Use: Never used  Substance and Sexual Activity   Alcohol use: Not Currently    Comment: occ   Drug use: Yes    Types: Marijuana    Comment: occasionally smokes marijuana   Sexual activity: Yes  Other Topics Concern   Not on file  Social History Narrative   ** Merged History Encounter **       Social Determinants of Health   Financial Resource Strain: High Risk   Difficulty of Paying Living Expenses: Hard  Food Insecurity: Food Insecurity Present   Worried About Running Out of Food in the Last Year: Sometimes true   Ran Out of Food in the Last Year: Sometimes true  Transportation Needs: No Transportation Needs   Lack of Transportation (Medical): No   Lack of Transportation (Non-Medical): No  Physical Activity: Inactive  Days of Exercise per Week: 0 days   Minutes of Exercise per Session: 0 min  Stress: Stress Concern Present   Feeling of Stress : Very much  Social Connections: Not on file    CHL HIV PREP FLOWSHEET RESULTS 01/21/2020 07/15/2019  Insurance Status Uninsured Uninsured  Gender at birth Male Male  Gender identity cis-Male cis-Male  Risk for HIV - In sexual relationship with HIV+ partner;Condomless vaginal or anal intercourse;Hx of STI  Sex Partners Both men and women Both men and women  # sex partners past 3-6 mos 1-3 1-3  Sex activity preferences Insertive;Oral Insertive;Oral  Condom use Yes No  % condom use 100 -  Treated for STI? No No  HIV symptoms? - N/A  PrEP Eligibility - Substantial risk for HIV    Labs:  SCr: Lab Results   Component Value Date   CREATININE 1.01 02/10/2021   CREATININE 1.09 08/03/2020   CREATININE 1.04 01/29/2020   CREATININE 1.07 01/21/2020   CREATININE 0.97 07/15/2019   HIV Lab Results  Component Value Date   HIV NON-REACTIVE 02/10/2021   HIV NON-REACTIVE 11/02/2020   HIV Non Reactive 10/24/2020   HIV NON-REACTIVE 08/03/2020   HIV NON-REACTIVE 05/18/2020   Hepatitis B Lab Results  Component Value Date   HEPBSAB REACTIVE (A) 07/15/2019   HEPBSAG NON-REACTIVE 07/15/2019   Hepatitis C Lab Results  Component Value Date   HEPCAB REACTIVE (A) 05/18/2020   HCVRNAPCRQN <15 DETECTED (A) 05/18/2020   Hepatitis A Lab Results  Component Value Date   HAV REACTIVE (A) 07/15/2019   RPR and STI Lab Results  Component Value Date   LABRPR NON-REACTIVE 02/10/2021   LABRPR Non Reactive 10/24/2020   LABRPR NON-REACTIVE 04/19/2020   LABRPR NON-REACTIVE 01/21/2020   LABRPR Non Reactive 07/01/2019    STI Results GC CT  02/10/2021 Positive(A) Negative  02/10/2021 Negative Negative  10/24/2020 Negative Negative  08/19/2020 Negative Negative  08/19/2020 Negative Negative  08/03/2020 Negative Negative  08/03/2020 Positive(A) Negative  05/15/2020 Positive(A) Negative  05/15/2020 Positive(A) Negative  04/19/2020 Negative Negative  04/19/2020 Negative Negative  04/19/2020 Negative Negative  01/21/2020 Negative Negative  01/21/2020 Negative Negative  01/21/2020 Negative Negative  07/01/2019 Negative Negative  10/09/2018 - Negative    Assessment: Marc Sandoval is here today to follow up for HIV PrEP. He has not taken Descovy lately as he has not been very sexually active. He wishes to get testing today. Encouraged him to take Descovy again if he becomes active. Encouraged to use condoms if he does have sex. Will check all labs today and see him back in 3 months.   Plan: HIV antibody, BMET, RPR, urine/oral/rectal gonorrhea/chlamydia cytology today - Will send in refills if negative just in case he  chooses to start again - F/u in 3 months   Marc Sandoval, PharmD, BCIDP, AAHIVP, CPP Clinical Pharmacist Practitioner Infectious Diseases Clinical Pharmacist [facility] 02/12/2021, 2:41 PM

## 2021-02-11 ENCOUNTER — Ambulatory Visit (HOSPITAL_COMMUNITY)
Admission: EM | Admit: 2021-02-11 | Discharge: 2021-02-11 | Disposition: A | Discharge status: Home / Self Care | Payer: Self-pay | Attending: Emergency Medicine | Admitting: Emergency Medicine

## 2021-02-11 ENCOUNTER — Other Ambulatory Visit: Payer: Self-pay

## 2021-02-11 ENCOUNTER — Telehealth: Payer: Self-pay | Admitting: Pharmacist

## 2021-02-11 ENCOUNTER — Encounter (HOSPITAL_COMMUNITY): Payer: Self-pay

## 2021-02-11 ENCOUNTER — Other Ambulatory Visit: Payer: Self-pay | Admitting: Pharmacist

## 2021-02-11 ENCOUNTER — Other Ambulatory Visit (HOSPITAL_COMMUNITY): Payer: Self-pay

## 2021-02-11 ENCOUNTER — Encounter: Payer: Self-pay | Admitting: Pharmacist

## 2021-02-11 DIAGNOSIS — A545 Gonococcal pharyngitis: Secondary | ICD-10-CM

## 2021-02-11 DIAGNOSIS — Z79899 Other long term (current) drug therapy: Secondary | ICD-10-CM

## 2021-02-11 DIAGNOSIS — F419 Anxiety disorder, unspecified: Secondary | ICD-10-CM

## 2021-02-11 HISTORY — DX: Post-traumatic stress disorder, unspecified: F43.10

## 2021-02-11 LAB — CYTOLOGY, (ORAL, ANAL, URETHRAL) ANCILLARY ONLY
Chlamydia: NEGATIVE
Chlamydia: NEGATIVE
Comment: NEGATIVE
Comment: NEGATIVE
Comment: NORMAL
Comment: NORMAL
Neisseria Gonorrhea: NEGATIVE
Neisseria Gonorrhea: POSITIVE — AB

## 2021-02-11 LAB — BASIC METABOLIC PANEL
BUN: 15 mg/dL (ref 7–25)
CO2: 29 mmol/L (ref 20–32)
Calcium: 9.2 mg/dL (ref 8.6–10.3)
Chloride: 105 mmol/L (ref 98–110)
Creat: 1.01 mg/dL (ref 0.60–1.35)
Glucose, Bld: 89 mg/dL (ref 65–99)
Potassium: 4.1 mmol/L (ref 3.5–5.3)
Sodium: 141 mmol/L (ref 135–146)

## 2021-02-11 LAB — RPR: RPR Ser Ql: NONREACTIVE

## 2021-02-11 LAB — HIV ANTIBODY (ROUTINE TESTING W REFLEX): HIV 1&2 Ab, 4th Generation: NONREACTIVE

## 2021-02-11 MED ORDER — CEFTRIAXONE SODIUM 500 MG IJ SOLR
500.0000 mg | Freq: Once | INTRAMUSCULAR | Status: AC
  Administered 2021-02-11: 18:00:00 500 mg via INTRAMUSCULAR

## 2021-02-11 MED ORDER — LIDOCAINE HCL (PF) 1 % IJ SOLN
INTRAMUSCULAR | Status: AC
  Filled 2021-02-11: qty 2

## 2021-02-11 MED ORDER — CEFTRIAXONE SODIUM 500 MG IJ SOLR
INTRAMUSCULAR | Status: AC
  Filled 2021-02-11: qty 500

## 2021-02-11 MED ORDER — DESCOVY 200-25 MG PO TABS
1.0000 | ORAL_TABLET | Freq: Every day | ORAL | 2 refills | Status: DC
  Filled 2021-02-11: qty 30, 30d supply, fill #0

## 2021-02-11 NOTE — ED Triage Notes (Addendum)
Pt reports is positive for gonorrhea in throat. States was called and told to come to urgent care for treatment. Reports mild sore throat and severe headaches and would like to be seen for this.

## 2021-02-11 NOTE — Telephone Encounter (Signed)
Called patient to relay positive gonorrhea test. No answer. He will need ceftriaxone 500 mg IM x 1 for treatment. Will continue to keep trying to reach him.

## 2021-02-11 NOTE — Telephone Encounter (Signed)
Patient returned call. RN relayed results per Cassie, offered appointment for treatment Monday 6/13, 2:45. Patient upset at the delay in treatment. RN advised this was a test result that just came through this afternoon, was relayed to him within an hour of it resulting. Patient wanted treatment today, no appointments available. He will try to go to urgent care, otherwise will come to [facility] 6/13. Andree Coss, RN

## 2021-02-11 NOTE — ED Provider Notes (Signed)
[facility]    CSN: [id number] Arrival date & time: 02/11/21  1616      History   Chief Complaint Chief Complaint  Patient presents with   Treatment   Sore Throat   Headache    HPI Marc Sandoval is a 30 y.o. male.   Patient here for treatment of gonorrhea at the throat.  Reports being tested in infectious disease but they were unable to treat him today.  Denies any significant symptoms.  Patient does report having some anxiety and also wanting to get a referral for primary care.  Denies any trauma, injury, or other precipitating event.  Denies any specific alleviating or aggravating factors.  Denies any fevers, chest pain, shortness of breath, N/V/D, numbness, tingling, weakness, abdominal pain, or headaches.    The history is provided by the patient.  Sore Throat Pertinent negatives include no headaches.  Headache  Past Medical History:  Diagnosis Date   Anxiety    Depression    Hypertension    Migraines    PTSD (post-traumatic stress disorder)     Patient Active Problem List   Diagnosis Date Noted   Generalized anxiety disorder 10/15/2020   PTSD (post-traumatic stress disorder) 10/15/2020   Major depressive disorder, recurrent episode, moderate (HCC) 09/06/2020   History of substance abuse (HCC) 09/06/2020   Panic attack as reaction to stress 09/06/2020   Vitamin D deficiency 02/06/2020   Closed fracture of left ankle 10/13/2018    Past Surgical History:  Procedure Laterality Date   OTHER SURGICAL HISTORY     pt reports head surgery in past   skin graft on back         Home Medications    Prior to Admission medications   Medication Sig Start Date End Date Taking? Authorizing Provider  ALPRAZolam (XANAX) 0.25 MG tablet Take 1 tablet (0.25 mg total) by mouth at bedtime as needed for anxiety. 01/03/21  Yes Wieters, Hallie C, PA-C  emtricitabine-tenofovir AF (DESCOVY) 200-25 MG tablet Take 1 tablet by mouth daily. 02/11/21  Yes Kuppelweiser, Cassie  L, RPH-CPP  fluticasone (FLONASE) 50 MCG/ACT nasal spray Place 1-2 sprays into both nostrils daily. 01/03/21  Yes Wieters, Hallie C, PA-C  naproxen (NAPROSYN) 500 MG tablet Take 1 tablet (500 mg total) by mouth 2 (two) times daily. 01/03/21  Yes Wieters, Hallie C, PA-C  buPROPion (WELLBUTRIN SR) 150 MG 12 hr tablet TAKE 1 TABLET (150 MG TOTAL) BY MOUTH 2 (TWO) TIMES DAILY. Patient not taking: No sig reported 09/01/20 09/01/21  Hoy Register, MD  busPIRone (BUSPAR) 5 MG tablet TAKE 1 TABLET BY MOUTH 2 TIMES DAILY Patient not taking: Reported on 01/03/2021 10/15/20 10/15/21  Karel Jarvis E, PA  FLUoxetine (PROZAC) 20 MG capsule TAKE 1 CAPSULE BY MOUTH ONCE A DAY Patient not taking: Reported on 01/03/2021 10/15/20 10/15/21  Karel Jarvis E, PA  hydrOXYzine (ATARAX/VISTARIL) 50 MG tablet TAKE 1 TABLET (50 MG TOTAL) BY MOUTH EVERY 12 (TWELVE) HOURS AS NEEDED. Patient not taking: Reported on 01/03/2021 09/01/20 09/01/21  Hoy Register, MD  dicyclomine (BENTYL) 20 MG tablet Take 1 tablet (20 mg total) by mouth 2 (two) times daily. 06/20/19 01/31/20  Cathie Hoops, Amy V, PA-C  PARoxetine (PAXIL) 20 MG tablet Take 1 tablet (20 mg total) by mouth daily. 08/04/20 08/31/20  Hoy Register, MD    Family History Family History  Problem Relation Age of Onset   Cancer Father    Healthy Daughter    Heart disease Maternal Grandmother  Cancer Paternal Grandmother    Aneurysm Maternal Aunt    Heart attack Maternal Aunt    Cirrhosis Paternal Grandfather    Cirrhosis Maternal Uncle    Cancer Maternal Uncle    HIV/AIDS Maternal Uncle    Stroke Neg Hx     Social History Social History   Tobacco Use   Smoking status: Never   Smokeless tobacco: Never  Vaping Use   Vaping Use: Never used  Substance Use Topics   Alcohol use: Not Currently    Comment: occ   Drug use: Yes    Types: Marijuana    Comment: occasionally smokes marijuana     Allergies   Dayquil [pseudoephedrine-apap-dm], Doxylamine-phenylephrine-apap,  Doxylamine-phenylephrine-apap, Nyquil hbp cold & flu [dm-doxylamine-acetaminophen], and Vancomycin   Review of Systems Review of Systems  Neurological:  Negative for headaches.  All other systems reviewed and are negative.   Physical Exam Triage Vital Signs ED Triage Vitals  Enc Vitals Group     BP 02/11/21 1658 (!) 148/85     Pulse Rate 02/11/21 1658 67     Resp 02/11/21 1658 18     Temp 02/11/21 1658 99.9 F (37.7 C)     Temp src --      SpO2 02/11/21 1658 99 %     Weight --      Height --      Head Circumference --      Peak Flow --      Pain Score 02/11/21 1648 0     Pain Loc --      Pain Edu? --      Excl. in GC? --    No data found.  Updated Vital Signs BP (!) 148/85   Pulse 67   Temp 99.9 F (37.7 C)   Resp 18   SpO2 99%   Visual Acuity Right Eye Distance:   Left Eye Distance:   Bilateral Distance:    Right Eye Near:   Left Eye Near:    Bilateral Near:     Physical Exam Vitals and nursing note reviewed.  Constitutional:      General: He is not in acute distress.    Appearance: Normal appearance. He is not ill-appearing, toxic-appearing or diaphoretic.  HENT:     Head: Normocephalic and atraumatic.  Eyes:     Conjunctiva/sclera: Conjunctivae normal.  Cardiovascular:     Rate and Rhythm: Normal rate.     Pulses: Normal pulses.  Pulmonary:     Effort: Pulmonary effort is normal.  Abdominal:     General: Abdomen is flat.  Genitourinary:    Comments: declines Musculoskeletal:        General: Normal range of motion.     Cervical back: Normal range of motion.  Skin:    General: Skin is warm and dry.  Neurological:     General: No focal deficit present.     Mental Status: He is alert and oriented to person, place, and time.  Psychiatric:        Mood and Affect: Mood normal.     UC Treatments / Results  Labs (all labs ordered are listed, but only abnormal results are displayed) Labs Reviewed - No data to display  EKG   Radiology No  results found.  Procedures Procedures (including critical care time)  Medications Ordered in UC Medications  cefTRIAXone (ROCEPHIN) injection 500 mg (has no administration in time range)    Initial Impression / Assessment and Plan / UC Course  I  have reviewed the triage vital signs and the nursing notes.  Pertinent labs & imaging results that were available during my care of the patient were reviewed by me and considered in my medical decision making (see chart for details).    Assessment negative for red flags or concerns.  Rocephin IM administered in office to treat gonorrhea of the throat.  Instructed patient to abstain from sexual activities for at least 7 days.  Discussed safe sex practices including condom or other barrier method use.  PCP assistance started and patient given mental health resources sheet.  Follow-up with primary care as needed.  Final Clinical Impressions(s) / UC Diagnoses   Final diagnoses:  Gonorrhea of pharynx in male  Anxiety     Discharge Instructions      Someone will contact you to help you get established with a primary care provider.    You have given rocephin IM in the office today.    Do not have sex while taking undergoing treatment for STI.  Make sure that all of your partners get tested and treated.   Use a condom or other barrier method for all sexual encounters.    Return or go to the Emergency Department if symptoms worsen or do not improve in the next few days.       ED Prescriptions   None    PDMP not reviewed this encounter.   Ivette Loyal, NP 02/11/21 [phone]

## 2021-02-11 NOTE — Telephone Encounter (Signed)
Marc Sandoval "[name]"  Mychart, Generic 42 minutes ago (5:17 PM)     I went to urgent care this appt can be cancelled.     Mychart, Generic  Marc Sandoval "[name]" 3 hours ago (2:56 PM)   GM   Appointment Information:     Visit Type: FOLLOW UP PREP         Date: 02/14/2021                 Dept: [facility]                 Provider: Aggie Cosier                 Time: 2:30 PM                 Length: 15 min   Appt Status: Scheduled

## 2021-02-11 NOTE — Discharge Instructions (Addendum)
Someone will contact you to help you get established with a primary care provider.    You have given rocephin IM in the office today.    Do not have sex while taking undergoing treatment for STI.  Make sure that all of your partners get tested and treated.   Use a condom or other barrier method for all sexual encounters.    Return or go to the Emergency Department if symptoms worsen or do not improve in the next few days.

## 2021-02-14 ENCOUNTER — Ambulatory Visit: Payer: Medicaid Other | Admitting: Pharmacist

## 2021-02-18 ENCOUNTER — Telehealth: Payer: Self-pay

## 2021-02-18 NOTE — Telephone Encounter (Signed)
Oral swab from 02/10/21 positive for gonorrhea. Per chart, patient treated with 500mg  ceftriaxone IM on 02/11/21 at urgent care.   04/13/21, RN

## 2021-02-20 ENCOUNTER — Ambulatory Visit (HOSPITAL_COMMUNITY)
Admission: EM | Admit: 2021-02-20 | Discharge: 2021-02-20 | Disposition: A | Discharge status: Home / Self Care | Payer: Medicaid Other | Attending: Student | Admitting: Student

## 2021-02-20 DIAGNOSIS — R300 Dysuria: Secondary | ICD-10-CM

## 2021-02-20 DIAGNOSIS — Z113 Encounter for screening for infections with a predominantly sexual mode of transmission: Secondary | ICD-10-CM | POA: Insufficient documentation

## 2021-02-20 LAB — POCT URINALYSIS DIPSTICK, ED / UC
Bilirubin Urine: NEGATIVE
Glucose, UA: NEGATIVE mg/dL
Hgb urine dipstick: NEGATIVE
Ketones, ur: NEGATIVE mg/dL
Leukocytes,Ua: NEGATIVE
Nitrite: NEGATIVE
Protein, ur: NEGATIVE mg/dL
Specific Gravity, Urine: 1.02 (ref 1.005–1.030)
Urobilinogen, UA: 0.2 mg/dL (ref 0.0–1.0)
pH: 7.5 (ref 5.0–8.0)

## 2021-02-20 MED ORDER — DOXYCYCLINE HYCLATE 100 MG PO CAPS: 100.0000 mg | ORAL_CAPSULE | Freq: Two times a day (BID) | ORAL | 0 refills | Status: AC

## 2021-02-20 NOTE — ED Provider Notes (Signed)
[facility]    CSN: [id number] Arrival date & time: 02/20/21  1223      History   Chief Complaint Chief Complaint  Patient presents with   burning sensation in penis    HPI Marc Sandoval is a 30 y.o. male presenting with dysuria for 3 days.  This patient was last seen at our urgent care on 6/10 when he was treated for pharyngeal gonorrhea.  States that he never had any symptoms, but he follows with infectious disease and they tested him for this.  States that he is again not having any pharyngeal symptoms.  Does note 3 days of dysuria.  Describes this as constant, not exacerbated by urination.  Denies abdominal pain, back pain, fever/chills, penile lesions, penile pain, testicular swelling/pain.  Denies any new partner since his last screening.  HPI  Past Medical History:  Diagnosis Date   Anxiety    Depression    Hypertension    Migraines    PTSD (post-traumatic stress disorder)     Patient Active Problem List   Diagnosis Date Noted   Generalized anxiety disorder 10/15/2020   PTSD (post-traumatic stress disorder) 10/15/2020   Major depressive disorder, recurrent episode, moderate (HCC) 09/06/2020   History of substance abuse (HCC) 09/06/2020   Panic attack as reaction to stress 09/06/2020   Vitamin D deficiency 02/06/2020   Closed fracture of left ankle 10/13/2018    Past Surgical History:  Procedure Laterality Date   OTHER SURGICAL HISTORY     pt reports head surgery in past   skin graft on back         Home Medications    Prior to Admission medications   Medication Sig Start Date End Date Taking? Authorizing Provider  doxycycline (VIBRAMYCIN) 100 MG capsule Take 1 capsule (100 mg total) by mouth 2 (two) times daily for 7 days. 02/20/21 02/27/21 Yes Rhys Martini, PA-C  ALPRAZolam Prudy Feeler) 0.25 MG tablet Take 1 tablet (0.25 mg total) by mouth at bedtime as needed for anxiety. 01/03/21   Wieters, Hallie C, PA-C  buPROPion (WELLBUTRIN SR) 150 MG 12 hr  tablet TAKE 1 TABLET (150 MG TOTAL) BY MOUTH 2 (TWO) TIMES DAILY. Patient not taking: No sig reported 09/01/20 09/01/21  Hoy Register, MD  busPIRone (BUSPAR) 5 MG tablet TAKE 1 TABLET BY MOUTH 2 TIMES DAILY Patient not taking: Reported on 01/03/2021 10/15/20 10/15/21  Meta Hatchet, PA  emtricitabine-tenofovir AF (DESCOVY) 200-25 MG tablet Take 1 tablet by mouth daily. 02/11/21   Kuppelweiser, Cassie L, RPH-CPP  FLUoxetine (PROZAC) 20 MG capsule TAKE 1 CAPSULE BY MOUTH ONCE A DAY Patient not taking: Reported on 01/03/2021 10/15/20 10/15/21  Nwoko, Tommas Olp, PA  fluticasone (FLONASE) 50 MCG/ACT nasal spray Place 1-2 sprays into both nostrils daily. 01/03/21   Wieters, Hallie C, PA-C  hydrOXYzine (ATARAX/VISTARIL) 50 MG tablet TAKE 1 TABLET (50 MG TOTAL) BY MOUTH EVERY 12 (TWELVE) HOURS AS NEEDED. Patient not taking: Reported on 01/03/2021 09/01/20 09/01/21  Hoy Register, MD  naproxen (NAPROSYN) 500 MG tablet Take 1 tablet (500 mg total) by mouth 2 (two) times daily. 01/03/21   Wieters, Hallie C, PA-C  dicyclomine (BENTYL) 20 MG tablet Take 1 tablet (20 mg total) by mouth 2 (two) times daily. 06/20/19 01/31/20  Cathie Hoops, Amy V, PA-C  PARoxetine (PAXIL) 20 MG tablet Take 1 tablet (20 mg total) by mouth daily. 08/04/20 08/31/20  Hoy Register, MD    Family History Family History  Problem Relation Age of Onset  Cancer Father    Healthy Daughter    Heart disease Maternal Grandmother    Cancer Paternal Grandmother    Aneurysm Maternal Aunt    Heart attack Maternal Aunt    Cirrhosis Paternal Grandfather    Cirrhosis Maternal Uncle    Cancer Maternal Uncle    HIV/AIDS Maternal Uncle    Stroke Neg Hx     Social History Social History   Tobacco Use   Smoking status: Never   Smokeless tobacco: Never  Vaping Use   Vaping Use: Never used  Substance Use Topics   Alcohol use: Not Currently    Comment: occ   Drug use: Yes    Types: Marijuana    Comment: occasionally smokes marijuana      Allergies   Dayquil [pseudoephedrine-apap-dm], Doxylamine-phenylephrine-apap, Doxylamine-phenylephrine-apap, Nyquil hbp cold & flu [dm-doxylamine-acetaminophen], and Vancomycin   Review of Systems Review of Systems  Constitutional:  Negative for chills and fever.  HENT:  Negative for sore throat.   Eyes:  Negative for pain and redness.  Respiratory:  Negative for shortness of breath.   Cardiovascular:  Negative for chest pain.  Gastrointestinal:  Negative for abdominal pain, diarrhea, nausea and vomiting.  Genitourinary:  Positive for dysuria. Negative for decreased urine volume, difficulty urinating, flank pain, frequency, genital sores, hematuria, penile discharge, penile pain, penile swelling, scrotal swelling, testicular pain and urgency.  Musculoskeletal:  Negative for back pain.  Skin:  Negative for rash.  All other systems reviewed and are negative.   Physical Exam Triage Vital Signs ED Triage Vitals  Enc Vitals Group     BP 02/20/21 1425 (!) 159/103     Pulse Rate 02/20/21 1425 (!) 50     Resp 02/20/21 1425 19     Temp 02/20/21 1425 98.2 F (36.8 C)     Temp Source 02/20/21 1425 Oral     SpO2 02/20/21 1425 98 %     Weight --      Height --      Head Circumference --      Peak Flow --      Pain Score 02/20/21 1424 7     Pain Loc --      Pain Edu? --      Excl. in GC? --    No data found.  Updated Vital Signs BP (!) 159/103 (BP Location: Left Arm)   Pulse (!) 50   Temp 98.2 F (36.8 C) (Oral)   Resp 19   SpO2 98%   Visual Acuity Right Eye Distance:   Left Eye Distance:   Bilateral Distance:    Right Eye Near:   Left Eye Near:    Bilateral Near:     Physical Exam Vitals reviewed.  Constitutional:      General: He is not in acute distress.    Appearance: Normal appearance. He is not ill-appearing.  HENT:     Head: Normocephalic and atraumatic.     Mouth/Throat:     Mouth: Mucous membranes are moist.     Comments: Moist mucous  membranes Eyes:     Extraocular Movements: Extraocular movements intact.     Pupils: Pupils are equal, round, and reactive to light.  Cardiovascular:     Rate and Rhythm: Normal rate and regular rhythm.     Heart sounds: Normal heart sounds.  Pulmonary:     Effort: Pulmonary effort is normal.     Breath sounds: Normal breath sounds. No wheezing, rhonchi or rales.  Abdominal:  General: Bowel sounds are normal. There is no distension.     Palpations: Abdomen is soft. There is no mass.     Tenderness: There is no abdominal tenderness. There is no right CVA tenderness, left CVA tenderness, guarding or rebound.  Genitourinary:    Comments: deferred Skin:    General: Skin is warm.     Capillary Refill: Capillary refill takes less than 2 seconds.     Comments: Good skin turgor  Neurological:     General: No focal deficit present.     Mental Status: He is alert and oriented to person, place, and time.  Psychiatric:        Mood and Affect: Mood normal.        Behavior: Behavior normal.     UC Treatments / Results  Labs (all labs ordered are listed, but only abnormal results are displayed) Labs Reviewed  URINE CULTURE  POCT URINALYSIS DIPSTICK, ED / UC    EKG   Radiology No results found.  Procedures Procedures (including critical care time)  Medications Ordered in UC Medications - No data to display  Initial Impression / Assessment and Plan / UC Course  I have reviewed the triage vital signs and the nursing notes.  Pertinent labs & imaging results that were available during my care of the patient were reviewed by me and considered in my medical decision making (see chart for details).     This patient is a 29 year old male presenting with dysuria.  Afebrile, nontachycardic, no abdominal pain or CVAT.  UA today wnl. Culture sent. Repeat pharyngeal swab sent.  Patient adamantly declines urethral swab today multiple times. He did have a pharyngeal and rectal swab  taken 2 weeks ago, which was negative for everything except pharyngeal gonorrhea, which he was treated for on 6/10 at our urgent care.   Discussed risks and benefits of treating for additional STIs today vs waiting for urine culture. Patient wishes to treat for chlamydia today, so doxycycline sent as below.   Coding this visit a Level 4 for review of past labs and notes (02/2021), ordering and review of labs today, and prescription drug management.   Final Clinical Impressions(s) / UC Diagnoses   Final diagnoses:  Routine screening for STI (sexually transmitted infection)     Discharge Instructions      -Start the antibiotic, doxycycline twice daily for 7 days.  Make sure to wear sunscreen while spending periods of time outside on this medication as it can increase your chance of sunburn. -Follow-up with [place] or infectious disease if your symptoms worsen or persist, including new penile symptoms, penile discharge, abdominal pain, back pain, new fever/chills.     ED Prescriptions     Medication Sig Dispense Auth. Provider   doxycycline (VIBRAMYCIN) 100 MG capsule Take 1 capsule (100 mg total) by mouth 2 (two) times daily for 7 days. 14 capsule Rhys Martini, PA-C      PDMP not reviewed this encounter.   Rhys Martini, PA-C 02/20/21 1524

## 2021-02-20 NOTE — ED Triage Notes (Signed)
Pt in with c/o constant burning sensation in penis for a few days  Pt states it does not burn when he urinates. Denies any penile discharge or other urinary sxs  No sexual intercourse since being treated for std approx 9 days ago

## 2021-02-20 NOTE — Discharge Instructions (Addendum)
-  Start the antibiotic, doxycycline twice daily for 7 days.  Make sure to wear sunscreen while spending periods of time outside on this medication as it can increase your chance of sunburn. -Follow-up with [place] or infectious disease if your symptoms worsen or persist, including new penile symptoms, penile discharge, abdominal pain, back pain, new fever/chills.

## 2021-02-21 LAB — CYTOLOGY, (ORAL, ANAL, URETHRAL) ANCILLARY ONLY
Chlamydia: NEGATIVE
Comment: NEGATIVE
Comment: NORMAL
Neisseria Gonorrhea: POSITIVE — AB

## 2021-02-21 LAB — URINE CULTURE: Culture: NO GROWTH

## 2021-02-22 ENCOUNTER — Ambulatory Visit (INDEPENDENT_AMBULATORY_CARE_PROVIDER_SITE_OTHER): Payer: Self-pay

## 2021-02-22 ENCOUNTER — Telehealth: Payer: Self-pay

## 2021-02-22 ENCOUNTER — Other Ambulatory Visit: Payer: Self-pay

## 2021-02-22 DIAGNOSIS — A549 Gonococcal infection, unspecified: Secondary | ICD-10-CM

## 2021-02-22 MED ORDER — CEFTRIAXONE SODIUM 500 MG IJ SOLR
500.0000 mg | Freq: Once | INTRAMUSCULAR | Status: AC
  Administered 2021-02-22: 500 mg via INTRAMUSCULAR

## 2021-02-22 NOTE — Telephone Encounter (Signed)
Thanks Megan!

## 2021-02-22 NOTE — Telephone Encounter (Signed)
Patient called, states he tested positive for gonorrhea again at urgent care on 02/20/21. He was prescribed doxycycline and is about 2 days into treatment. He is still having symptoms of discomfort. Spoke with Aggie Cosier, RPH who ordered an additional dose of ceftriaxone 500mg  IM once. Patient scheduled for nurse visit today.   , RN

## 2021-02-23 ENCOUNTER — Telehealth (HOSPITAL_COMMUNITY): Payer: Self-pay | Admitting: Emergency Medicine

## 2021-02-23 ENCOUNTER — Telehealth: Payer: Self-pay

## 2021-02-23 NOTE — Telephone Encounter (Signed)
Pt treated through infectious disease.

## 2021-02-23 NOTE — Telephone Encounter (Signed)
Received notice that patient had called.  Had Ignacia Bayley, PA review medical record.  Was instructed to have patient return for injection .  Called patient verified identity with 2 identifiers.  Instructed patient that he needed to return for a nurse visit to have administration of medication.  Patient reports he already did this yesterday.  No record located to support this.  Spoke to [employer], AD.  Will takeover documentation of this patient and treatment

## 2021-02-23 NOTE — Telephone Encounter (Signed)
Patient came in yesterday to receive another injection. Thank you!

## 2021-02-26 ENCOUNTER — Ambulatory Visit (HOSPITAL_COMMUNITY)
Admission: EM | Admit: 2021-02-26 | Discharge: 2021-02-26 | Disposition: A | Discharge status: Home / Self Care | Payer: Medicaid Other | Attending: Medical Oncology | Admitting: Medical Oncology

## 2021-02-26 ENCOUNTER — Encounter (HOSPITAL_COMMUNITY): Payer: Self-pay

## 2021-02-26 DIAGNOSIS — Z113 Encounter for screening for infections with a predominantly sexual mode of transmission: Secondary | ICD-10-CM | POA: Insufficient documentation

## 2021-02-26 DIAGNOSIS — F411 Generalized anxiety disorder: Secondary | ICD-10-CM | POA: Insufficient documentation

## 2021-02-26 DIAGNOSIS — Z8619 Personal history of other infectious and parasitic diseases: Secondary | ICD-10-CM | POA: Insufficient documentation

## 2021-02-26 DIAGNOSIS — G44221 Chronic tension-type headache, intractable: Secondary | ICD-10-CM

## 2021-02-26 MED ORDER — METHOCARBAMOL 500 MG PO TABS: 500.0000 mg | ORAL_TABLET | Freq: Two times a day (BID) | ORAL | 0 refills | Status: DC

## 2021-02-26 NOTE — ED Provider Notes (Signed)
[facility]    CSN: [id number] Arrival date & time: 02/26/21  1419      History   Chief Complaint Chief Complaint  Patient presents with   Penile burning   Headache   Anxiety    HPI Marc Sandoval is a 30 y.o. male.   HPI  Penile Burning: According to patient's chart he was found to have tested positive for gonorrhea after his visit on 02/11/2021.  He was administered ceftriaxone at his initial visit.  He then returned to the ED on 02/20/2021 for retesting at which point his gonorrhea was again positive and he was given doxycycline for treatment.  It looks like he was then seen on 02/22/2021 by infectious disease at which point he received another injection of ceftriaxone.  Patient presents today as he still has continued penile burning sensation.  Anxiety: Chronic in nature.  He states that he has been seen by behavioral health and has tried numerous medications without success of symptoms.  He states that medicine such as Xanax tend to make him groggy and do not really help with his anxiety.  He has tried hydroxyzine and did not find this helpful as well.  He states he has an appointment on Monday to drop off paperwork for a new PCP and continued care.  Denies SI/HI.  Headaches: Patient reports that he has had headaches since around age 75.  Used to be more significant but now occurs more frequently.  Tends to happen when he is more stressed.  Feels like a tension sensation from his neck wrapping up to his head.  He has tried over-the-counter pain medication without much success.  No light sensitivity, vomiting, neuro changes.   Past Medical History:  Diagnosis Date   Anxiety    Depression    Hypertension    Migraines    PTSD (post-traumatic stress disorder)     Patient Active Problem List   Diagnosis Date Noted   Generalized anxiety disorder 10/15/2020   PTSD (post-traumatic stress disorder) 10/15/2020   Major depressive disorder, recurrent episode, moderate  (HCC) 09/06/2020   History of substance abuse (HCC) 09/06/2020   Panic attack as reaction to stress 09/06/2020   Vitamin D deficiency 02/06/2020   Closed fracture of left ankle 10/13/2018    Past Surgical History:  Procedure Laterality Date   OTHER SURGICAL HISTORY     pt reports head surgery in past   skin graft on back         Home Medications    Prior to Admission medications   Medication Sig Start Date End Date Taking? Authorizing Provider  ALPRAZolam (XANAX) 0.25 MG tablet Take 1 tablet (0.25 mg total) by mouth at bedtime as needed for anxiety. 01/03/21  Yes Wieters, Hallie C, PA-C  doxycycline (VIBRAMYCIN) 100 MG capsule Take 1 capsule (100 mg total) by mouth 2 (two) times daily for 7 days. 02/20/21 02/27/21 Yes Rhys Martini, PA-C  emtricitabine-tenofovir AF (DESCOVY) 200-25 MG tablet Take 1 tablet by mouth daily. 02/11/21  Yes Kuppelweiser, Cassie L, RPH-CPP  fluticasone (FLONASE) 50 MCG/ACT nasal spray Place 1-2 sprays into both nostrils daily. 01/03/21  Yes Wieters, Hallie C, PA-C  naproxen (NAPROSYN) 500 MG tablet Take 1 tablet (500 mg total) by mouth 2 (two) times daily. 01/03/21  Yes Wieters, Hallie C, PA-C  buPROPion (WELLBUTRIN SR) 150 MG 12 hr tablet TAKE 1 TABLET (150 MG TOTAL) BY MOUTH 2 (TWO) TIMES DAILY. Patient not taking: No sig reported 09/01/20 09/01/21  Newlin,  Enobong, MD  busPIRone (BUSPAR) 5 MG tablet TAKE 1 TABLET BY MOUTH 2 TIMES DAILY Patient not taking: Reported on 01/03/2021 10/15/20 10/15/21  Karel Jarvis E, PA  FLUoxetine (PROZAC) 20 MG capsule TAKE 1 CAPSULE BY MOUTH ONCE A DAY Patient not taking: Reported on 01/03/2021 10/15/20 10/15/21  Karel Jarvis E, PA  hydrOXYzine (ATARAX/VISTARIL) 50 MG tablet TAKE 1 TABLET (50 MG TOTAL) BY MOUTH EVERY 12 (TWELVE) HOURS AS NEEDED. Patient not taking: Reported on 01/03/2021 09/01/20 09/01/21  Hoy Register, MD  dicyclomine (BENTYL) 20 MG tablet Take 1 tablet (20 mg total) by mouth 2 (two) times daily. 06/20/19 01/31/20   Cathie Hoops, Amy V, PA-C  PARoxetine (PAXIL) 20 MG tablet Take 1 tablet (20 mg total) by mouth daily. 08/04/20 08/31/20  Hoy Register, MD    Family History Family History  Problem Relation Age of Onset   Cancer Father    Healthy Daughter    Heart disease Maternal Grandmother    Cancer Paternal Grandmother    Aneurysm Maternal Aunt    Heart attack Maternal Aunt    Cirrhosis Paternal Grandfather    Cirrhosis Maternal Uncle    Cancer Maternal Uncle    HIV/AIDS Maternal Uncle    Stroke Neg Hx     Social History Social History   Tobacco Use   Smoking status: Never   Smokeless tobacco: Never  Vaping Use   Vaping Use: Never used  Substance Use Topics   Alcohol use: Yes    Comment: occ   Drug use: Yes    Types: Marijuana    Comment: occasionally smokes marijuana     Allergies   Dayquil [pseudoephedrine-apap-dm], Doxylamine-phenylephrine-apap, Doxylamine-phenylephrine-apap, Nyquil hbp cold & flu [dm-doxylamine-acetaminophen], and Vancomycin   Review of Systems Review of Systems  As stated above in HPI Physical Exam Triage Vital Signs ED Triage Vitals  Enc Vitals Group     BP 02/26/21 1433 125/83     Pulse Rate 02/26/21 1433 70     Resp 02/26/21 1433 18     Temp 02/26/21 1433 99.3 F (37.4 C)     Temp src --      SpO2 02/26/21 1433 100 %     Weight --      Height --      Head Circumference --      Peak Flow --      Pain Score 02/26/21 1427 7     Pain Loc --      Pain Edu? --      Excl. in GC? --    No data found.  Updated Vital Signs BP 125/83   Pulse 70   Temp 99.3 F (37.4 C)   Resp 18   SpO2 100%   Physical Exam Vitals and nursing note reviewed.  Constitutional:      General: He is not in acute distress.    Appearance: He is well-developed. He is not ill-appearing, toxic-appearing or diaphoretic.  HENT:     Head: Normocephalic and atraumatic.     Mouth/Throat:     Mouth: Mucous membranes are moist.  Eyes:     General: No visual field deficit.     Extraocular Movements: Extraocular movements intact.     Right eye: Normal extraocular motion and no nystagmus.     Left eye: Normal extraocular motion and no nystagmus.     Pupils: Pupils are equal, round, and reactive to light. Pupils are equal.     Right eye: Pupil is round and reactive.  Left eye: Pupil is round and reactive.  Cardiovascular:     Rate and Rhythm: Normal rate and regular rhythm.     Heart sounds: Normal heart sounds.  Pulmonary:     Effort: Pulmonary effort is normal.     Breath sounds: Normal breath sounds.  Musculoskeletal:        General: Normal range of motion.     Cervical back: Normal range of motion and neck supple.     Comments: Reproducible tenderness to palpation of bilateral trapezius muscles with palpable muscle tension and spasms.  Neurological:     Mental Status: He is alert and oriented to person, place, and time.     Cranial Nerves: No cranial nerve deficit, dysarthria or facial asymmetry.     Deep Tendon Reflexes: Reflexes normal.  Psychiatric:        Mood and Affect: Mood normal.        Speech: Speech normal.        Behavior: Behavior normal.     UC Treatments / Results  Labs (all labs ordered are listed, but only abnormal results are displayed) Labs Reviewed - No data to display  EKG   Radiology No results found.  Procedures Procedures (including critical care time)  Medications Ordered in UC Medications - No data to display  Initial Impression / Assessment and Plan / UC Course  I have reviewed the triage vital signs and the nursing notes.  Pertinent labs & imaging results that were available during my care of the patient were reviewed by me and considered in my medical decision making (see chart for details).     Initially patient was seen by a different practitioner however patient stated that this was not a good fit and wished to have a different provider for the encounter.  I made sure that I introduced myself and made  sure that patient felt comfortable with our visit making eye contact with him on a seat lower than him.  We were able to discuss his concerns in detail and I was able to provide expectations for care as well as understanding for his concerns. In terms of his gonorrhea we discussed the typical timeframe for retesting and as he was retested on day 10 he may have had a false positive result.  Since it has been over 14 days since his original test and has had 3 treatments I would recommend that we retest today and not doing additional treatment until these results are back.  I had a long discussion with patient about this and he is agreeable.  We then discussed his headaches and anxiety.  I offered hydroxyzine which I am able to send in for him but he declined.  Instead we have elected to trial of methocarbamol to see if this will help with his tension headaches which do help to drive his anxiety.  I have also recommended the behavioral health urgent care as an option should his symptoms worsen in the meantime as they have more access to treatment regimens.   I spent 40 minutes dedicated to the care of this patient (face to face and non-face to face) on the date of this encounter to include the above.   Final Clinical Impressions(s) / UC Diagnoses   Final diagnoses:  None   Discharge Instructions   None    ED Prescriptions   None    PDMP not reviewed this encounter.   Rushie Chestnut, [place] 02/26/21 1601

## 2021-02-26 NOTE — ED Triage Notes (Signed)
Pt reports he is returning to UC due to continued burning sensation in penis.  Pt also c/o anxiety and severe headaches.

## 2021-02-28 LAB — CYTOLOGY, (ORAL, ANAL, URETHRAL) ANCILLARY ONLY
Chlamydia: NEGATIVE
Chlamydia: NEGATIVE
Comment: NEGATIVE
Comment: NEGATIVE
Comment: NEGATIVE
Comment: NEGATIVE
Comment: NORMAL
Comment: NORMAL
Neisseria Gonorrhea: NEGATIVE
Neisseria Gonorrhea: NEGATIVE
Trichomonas: NEGATIVE
Trichomonas: NEGATIVE

## 2021-03-04 ENCOUNTER — Other Ambulatory Visit (HOSPITAL_COMMUNITY): Payer: Self-pay

## 2021-03-09 ENCOUNTER — Encounter: Payer: Self-pay | Admitting: Emergency Medicine

## 2021-03-09 ENCOUNTER — Ambulatory Visit
Admission: EM | Admit: 2021-03-09 | Discharge: 2021-03-09 | Disposition: A | Discharge status: Home / Self Care | Payer: Medicaid Other | Attending: Family Medicine | Admitting: Family Medicine

## 2021-03-09 ENCOUNTER — Other Ambulatory Visit: Payer: Self-pay

## 2021-03-09 DIAGNOSIS — S00521A Blister (nonthermal) of lip, initial encounter: Secondary | ICD-10-CM

## 2021-03-09 DIAGNOSIS — R220 Localized swelling, mass and lump, head: Secondary | ICD-10-CM

## 2021-03-09 DIAGNOSIS — F419 Anxiety disorder, unspecified: Secondary | ICD-10-CM

## 2021-03-09 DIAGNOSIS — R0602 Shortness of breath: Secondary | ICD-10-CM

## 2021-03-09 MED ORDER — DEXAMETHASONE SODIUM PHOSPHATE 10 MG/ML IJ SOLN
10.0000 mg | Freq: Once | INTRAMUSCULAR | Status: AC
  Administered 2021-03-09: 10 mg via INTRAMUSCULAR

## 2021-03-09 NOTE — ED Triage Notes (Signed)
Patient c/o swollen upper lip and rash that started yesterday.   Patient endorses onset of symptoms began after eating sushi.   Patient endorses throat itching.   Patient endorses increased swelling since onset of symptoms.   Patient endorses itchiness of lip area.   Patient denies SOB.   Patient endorses " a sharp chest pain when I'm stressed out". History of Anxiety.

## 2021-03-09 NOTE — ED Provider Notes (Addendum)
[name]    CSN: [id number] Arrival date & time: 03/09/21  1155      History   Chief Complaint Chief Complaint  Patient presents with   Oral Swelling   Rash    HPI Marc Sandoval is a 30 y.o. male.   Patient presenting today with 1 day history of a blistered, swollen area to the central portion of upper lip and feelings of throat itching and swelling.  He states he ate sushi 2 days prior to onset of symptoms but otherwise has had no new exposures to any new products, foods, medications.  Denies chest tightness, shortness of breath but did have a panic attack yesterday and states his anxiety is "through the roof "which is what he attributes the symptoms to.  So far has not tried anything over-the-counter for his symptoms.  No past history of any food allergies, states that NyQuil and DayQuil are the only thing he is ever reacted to and that was a rash.  Past Medical History:  Diagnosis Date   Anxiety    Depression    Hypertension    Migraines    PTSD (post-traumatic stress disorder)     Patient Active Problem List   Diagnosis Date Noted   Generalized anxiety disorder 10/15/2020   PTSD (post-traumatic stress disorder) 10/15/2020   Major depressive disorder, recurrent episode, moderate (HCC) 09/06/2020   History of substance abuse (HCC) 09/06/2020   Panic attack as reaction to stress 09/06/2020   Vitamin D deficiency 02/06/2020   Closed fracture of left ankle 10/13/2018    Past Surgical History:  Procedure Laterality Date   OTHER SURGICAL HISTORY     pt reports head surgery in past   skin graft on back       Home Medications    Prior to Admission medications   Medication Sig Start Date End Date Taking? Authorizing Provider  ALPRAZolam (XANAX) 0.25 MG tablet Take 1 tablet (0.25 mg total) by mouth at bedtime as needed for anxiety. 01/03/21   Wieters, Hallie C, PA-C  buPROPion (WELLBUTRIN SR) 150 MG 12 hr tablet TAKE 1 TABLET (150 MG TOTAL) BY MOUTH 2  (TWO) TIMES DAILY. Patient not taking: No sig reported 09/01/20 09/01/21  Hoy Register, MD  busPIRone (BUSPAR) 5 MG tablet TAKE 1 TABLET BY MOUTH 2 TIMES DAILY Patient not taking: No sig reported 10/15/20 10/15/21  Nwoko, Stephens Shire E, PA  emtricitabine-tenofovir AF (DESCOVY) 200-25 MG tablet Take 1 tablet by mouth daily. 02/11/21   Kuppelweiser, Cassie L, RPH-CPP  FLUoxetine (PROZAC) 20 MG capsule TAKE 1 CAPSULE BY MOUTH ONCE A DAY Patient not taking: No sig reported 10/15/20 10/15/21  Nwoko, Tommas Olp, PA  fluticasone (FLONASE) 50 MCG/ACT nasal spray Place 1-2 sprays into both nostrils daily. 01/03/21   Wieters, Hallie C, PA-C  hydrOXYzine (ATARAX/VISTARIL) 50 MG tablet TAKE 1 TABLET (50 MG TOTAL) BY MOUTH EVERY 12 (TWELVE) HOURS AS NEEDED. Patient not taking: No sig reported 09/01/20 09/01/21  Hoy Register, MD  methocarbamol (ROBAXIN) 500 MG tablet Take 1 tablet (500 mg total) by mouth 2 (two) times daily. 02/26/21   Rushie Chestnut, PA-C  naproxen (NAPROSYN) 500 MG tablet Take 1 tablet (500 mg total) by mouth 2 (two) times daily. 01/03/21   Wieters, Hallie C, PA-C  dicyclomine (BENTYL) 20 MG tablet Take 1 tablet (20 mg total) by mouth 2 (two) times daily. 06/20/19 01/31/20  Cathie Hoops, Amy V, PA-C  PARoxetine (PAXIL) 20 MG tablet Take 1 tablet (20 mg total) by  mouth daily. 08/04/20 08/31/20  Hoy Register, MD    Family History Family History  Problem Relation Age of Onset   Cancer Father    Healthy Daughter    Heart disease Maternal Grandmother    Cancer Paternal Grandmother    Aneurysm Maternal Aunt    Heart attack Maternal Aunt    Cirrhosis Paternal Grandfather    Cirrhosis Maternal Uncle    Cancer Maternal Uncle    HIV/AIDS Maternal Uncle    Stroke Neg Hx     Social History Social History   Tobacco Use   Smoking status: Never   Smokeless tobacco: Never  Vaping Use   Vaping Use: Never used  Substance Use Topics   Alcohol use: Yes    Comment: occ   Drug use: Yes    Types:  Marijuana    Comment: occasionally smokes marijuana     Allergies   Dayquil [pseudoephedrine-apap-dm], Doxylamine-phenylephrine-apap, Doxylamine-phenylephrine-apap, Nyquil hbp cold & flu [dm-doxylamine-acetaminophen], and Vancomycin   Review of Systems Review of Systems Per HPI  Physical Exam Triage Vital Signs ED Triage Vitals  Enc Vitals Group     BP 03/09/21 1245 129/85     Pulse Rate 03/09/21 1245 69     Resp 03/09/21 1245 15     Temp 03/09/21 1245 98.5 F (36.9 C)     Temp Source 03/09/21 1245 Oral     SpO2 03/09/21 1245 96 %     Weight --      Height --      Head Circumference --      Peak Flow --      Pain Score 03/09/21 1241 10     Pain Loc --      Pain Edu? --      Excl. in GC? --    No data found.  Updated Vital Signs BP 129/85 (BP Location: Left Arm)   Pulse 69   Temp 98.5 F (36.9 C) (Oral)   Resp 15   SpO2 96%   Visual Acuity Right Eye Distance:   Left Eye Distance:   Bilateral Distance:    Right Eye Near:   Left Eye Near:    Bilateral Near:     Physical Exam Vitals and nursing note reviewed.  Constitutional:      Appearance: Normal appearance.  HENT:     Head: Atraumatic.     Mouth/Throat:     Mouth: Mucous membranes are moist.     Pharynx: Oropharynx is clear. No oropharyngeal exudate or posterior oropharyngeal erythema.     Comments: Uvula midline, oral airway patent.  Small area of blistering to center of upper lip but no diffuse edema upper and lower lip Eyes:     Extraocular Movements: Extraocular movements intact.     Conjunctiva/sclera: Conjunctivae normal.  Cardiovascular:     Rate and Rhythm: Normal rate and regular rhythm.  Pulmonary:     Effort: Pulmonary effort is normal. No respiratory distress.     Breath sounds: Normal breath sounds. No wheezing or rales.  Musculoskeletal:        General: Normal range of motion.     Cervical back: Normal range of motion and neck supple.  Skin:    General: Skin is warm and dry.   Neurological:     General: No focal deficit present.     Mental Status: He is oriented to person, place, and time.  Psychiatric:        Mood and Affect: Mood normal.  Thought Content: Thought content normal.        Judgment: Judgment normal.     UC Treatments / Results  Labs (all labs ordered are listed, but only abnormal results are displayed) Labs Reviewed - No data to display  EKG   Radiology No results found.  Procedures Procedures (including critical care time)  Medications Ordered in UC Medications  dexamethasone (DECADRON) injection 10 mg (10 mg Intramuscular Given 03/09/21 1311)    Initial Impression / Assessment and Plan / UC Course  I have reviewed the triage vital signs and the nursing notes.  Pertinent labs & imaging results that were available during my care of the patient were reviewed by me and considered in my medical decision making (see chart for details).     Appears more consistent with a localized irritation reaction or herpes type blistering lesion than an allergic reaction and too long of a duration between the sushi and his symptoms for her to likely be related to this.  Do suspect that his chest tightness, shortness of breath yesterday was more anxiety related but in case underlying allergic reaction causing the symptoms we will give IM Decadron in clinic and have him take Zyrtec twice daily until symptoms fully resolve.  Continue typical anxiety medications and deep breathing exercises.  Discussed Aquaphor to the blistered area multiple times per day until fully resolved for protection.  Return for acutely worsening symptoms.  Final Clinical Impressions(s) / UC Diagnoses   Final diagnoses:  Lip swelling  Blister of lip  Shortness of breath  Anxiousness     Discharge Instructions      Take Zyrtec twice daily until the issue fully resolves.  Apply Aquaphor to the lips several times daily, avoid scented Chapstick's or spicy or acidic  food until she resolves.     ED Prescriptions   None    PDMP not reviewed this encounter.   Particia Nearing, [place] 03/09/21 1403    Particia Nearing, PA-C 03/09/21 1403    Particia Nearing, [place] 03/09/21 1404

## 2021-03-09 NOTE — Discharge Instructions (Addendum)
Take Zyrtec twice daily until the issue fully resolves.  Apply Aquaphor to the lips several times daily, avoid scented Chapstick's or spicy or acidic food until she resolves.

## 2021-03-25 ENCOUNTER — Other Ambulatory Visit (HOSPITAL_COMMUNITY): Payer: Self-pay

## 2021-03-29 ENCOUNTER — Telehealth: Payer: Self-pay
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
## 2021-03-31 ENCOUNTER — Other Ambulatory Visit (INDEPENDENT_AMBULATORY_CARE_PROVIDER_SITE_OTHER): Payer: Self-pay | Admitting: Internal Medicine

## 2021-03-31 ENCOUNTER — Encounter: Payer: Self-pay | Admitting: Internal Medicine

## 2021-03-31 ENCOUNTER — Other Ambulatory Visit: Payer: Self-pay

## 2021-03-31 DIAGNOSIS — Z79899 Other long term (current) drug therapy: Secondary | ICD-10-CM

## 2021-03-31 DIAGNOSIS — F411 Generalized anxiety disorder: Secondary | ICD-10-CM

## 2021-03-31 DIAGNOSIS — A549 Gonococcal infection, unspecified: Secondary | ICD-10-CM

## 2021-03-31 DIAGNOSIS — F331 Major depressive disorder, recurrent, moderate: Secondary | ICD-10-CM

## 2021-03-31 NOTE — Assessment & Plan Note (Signed)
I had him meet with our behavioral health counselor today and he will follow-up with her soon. 

## 2021-03-31 NOTE — Assessment & Plan Note (Signed)
He tells me that he broke off his relationship with his male partner and has not been sexually active over the past month.  He is currently not taking Descovy.  He has follow-up here in September.
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
## 2021-03-31 NOTE — Assessment & Plan Note (Addendum)
I doubt that his current, mild symptoms are due to persistent gonorrhea or UTI.  He describes himself as a hypochondriac at baseline and says that he knows that he is overly anxious now.  I will repeat all site testing for gonorrhea and chlamydia and get a urine specimen for UA and culture just in case.

## 2021-03-31 NOTE — Assessment & Plan Note (Signed)
I had him meet with our behavioral health counselor today and he will follow-up with her soon.

## 2021-04-01 LAB — URINE CYTOLOGY ANCILLARY ONLY
Chlamydia: NEGATIVE
Comment: NEGATIVE
Comment: NORMAL
Neisseria Gonorrhea: NEGATIVE

## 2021-04-01 LAB — URINALYSIS, ROUTINE W REFLEX MICROSCOPIC
Bilirubin Urine: NEGATIVE
Glucose, UA: NEGATIVE
Hgb urine dipstick: NEGATIVE
Ketones, ur: NEGATIVE
Leukocytes,Ua: NEGATIVE
Nitrite: NEGATIVE
Protein, ur: NEGATIVE
Specific Gravity, Urine: 1.022 (ref 1.001–1.035)
pH: 7 (ref 5.0–8.0)

## 2021-04-01 LAB — CYTOLOGY, (ORAL, ANAL, URETHRAL) ANCILLARY ONLY
Chlamydia: NEGATIVE
Chlamydia: NEGATIVE
Comment: NEGATIVE
Comment: NEGATIVE
Comment: NORMAL
Comment: NORMAL
Neisseria Gonorrhea: NEGATIVE
Neisseria Gonorrhea: NEGATIVE

## 2021-04-01 LAB — URINE CULTURE
MICRO NUMBER:: 12175621
Result:: NO GROWTH
SPECIMEN QUALITY:: ADEQUATE

## 2021-04-12 ENCOUNTER — Other Ambulatory Visit: Payer: Self-pay

## 2021-04-12 ENCOUNTER — Ambulatory Visit: Payer: Self-pay

## 2021-04-13 ENCOUNTER — Other Ambulatory Visit: Payer: Self-pay

## 2021-04-15 ENCOUNTER — Telehealth: Payer: Medicaid Other | Admitting: Physician Assistant

## 2021-04-15 DIAGNOSIS — K529 Noninfective gastroenteritis and colitis, unspecified: Secondary | ICD-10-CM

## 2021-04-15 MED ORDER — ONDANSETRON 4 MG PO TBDP: 4.0000 mg | ORAL_TABLET | Freq: Three times a day (TID) | ORAL | 0 refills | Status: DC | PRN

## 2021-04-15 NOTE — Progress Notes (Signed)
Virtual Visit Consent   Marc Sandoval, you are scheduled for a virtual visit with a [facility] provider today.     Just as with appointments in the office, your consent must be obtained to participate.  Your consent will be active for this visit and any virtual visit you may have with one of our providers in the next 365 days.     If you have a MyChart account, a copy of this consent can be sent to you electronically.  All virtual visits are billed to your insurance company just like a traditional visit in the office.    As this is a virtual visit, video technology does not allow for your provider to perform a traditional examination.  This may limit your provider's ability to fully assess your condition.  If your provider identifies any concerns that need to be evaluated in person or the need to arrange testing (such as labs, EKG, etc.), we will make arrangements to do so.     Although advances in technology are sophisticated, we cannot ensure that it will always work on either your end or our end.  If the connection with a video visit is poor, the visit may have to be switched to a telephone visit.  With either a video or telephone visit, we are not always able to ensure that we have a secure connection.     I need to obtain your verbal consent now.   Are you willing to proceed with your visit today?    Marc Sandoval has provided verbal consent on 04/15/2021 for a virtual visit (video or telephone).   [name], [place]   Date: 04/15/2021 4:16 PM   Virtual Visit via Video Note   I, [name], connected with  Marc Sandoval  ([id number], 1990/10/09) on 04/15/21 at  4:00 PM EDT by a video-enabled telemedicine application and verified that I am speaking with the correct person using two identifiers.  Location: Patient: Virtual Visit Location Patient: Home Provider: Virtual Visit Location Provider: Home Office   I discussed the limitations of evaluation and management by  telemedicine and the availability of in person appointments. The patient expressed understanding and agreed to proceed.    History of Present Illness: Marc Sandoval is a 30 y.o. who identifies as a male who was assigned male at birth, and is being seen today for GI symptoms. Patient endorses eating at a restaurant last night having a soup and a salad. This morning he woke up around 1 and started with nausea and vomiting. Had multiple episodes of non-bloody emesis, vomiting up all of his food. Then just clear/yellow liquid. No vomiting in 4 hours. Some associated loose stools but no major frequency. Has not been hydrating well due to vomiting but increasing this now that things have calmed. Is having good urinary output with light urine. Denies recent travel or sick contact.   HPI: HPI  Problems:  Patient Active Problem List   Diagnosis Date Noted   Gonorrhea 03/31/2021   On pre-exposure prophylaxis for HIV 03/31/2021   Generalized anxiety disorder 10/15/2020   PTSD (post-traumatic stress disorder) 10/15/2020   Major depressive disorder, recurrent episode, moderate (HCC) 09/06/2020   History of substance abuse (HCC) 09/06/2020   Panic attack as reaction to stress 09/06/2020   Vitamin D deficiency 02/06/2020   Closed fracture of left ankle 10/13/2018    Allergies:  Allergies  Allergen Reactions   Dayquil [Pseudoephedrine-Apap-Dm] Hives    Reports cause hives, sweats, cold  symptoms   Doxylamine-Phenylephrine-Apap Hives   Doxylamine-Phenylephrine-Apap Hives   Nyquil Hbp Cold & Flu [Dm-Doxylamine-Acetaminophen] Hives    Reports cause hives, sweats, cold symptoms   Vancomycin    Medications:  Current Outpatient Medications:    ondansetron (ZOFRAN ODT) 4 MG disintegrating tablet, Take 1 tablet (4 mg total) by mouth every 8 (eight) hours as needed for nausea or vomiting., Disp: 20 tablet, Rfl: 0   fluticasone (FLONASE) 50 MCG/ACT nasal spray, Place 1-2 sprays into both nostrils daily., Disp:  16 g, Rfl: 0   hydrOXYzine (ATARAX/VISTARIL) 50 MG tablet, TAKE 1 TABLET (50 MG TOTAL) BY MOUTH EVERY 12 (TWELVE) HOURS AS NEEDED., Disp: 60 tablet, Rfl: 0   methocarbamol (ROBAXIN) 500 MG tablet, Take 1 tablet (500 mg total) by mouth 2 (two) times daily., Disp: 20 tablet, Rfl: 0   naproxen (NAPROSYN) 500 MG tablet, Take 1 tablet (500 mg total) by mouth 2 (two) times daily., Disp: 30 tablet, Rfl: 0  Observations/Objective: Patient is well-developed, well-nourished in no acute distress.  Resting comfortably at home.  Head is normocephalic, atraumatic.  No labored breathing. Speech is clear and coherent with logical content.  Patient is alert and oriented at baseline.   Assessment and Plan: 1. Gastroenteritis - ondansetron (ZOFRAN ODT) 4 MG disintegrating tablet; Take 1 tablet (4 mg total) by mouth every 8 (eight) hours as needed for nausea or vomiting.  Dispense: 20 tablet; Refill: 0 No alarm signs of symptoms. Vomiting has stopped within past 4 hours. Nausea remains. Start zofran ODT for nausea. Sips of fluids until tolerating well. Will then start BRAT diet. Strict ER precautions reviewed with patient.   Follow Up Instructions: I discussed the assessment and treatment plan with the patient. The patient was provided an opportunity to ask questions and all were answered. The patient agreed with the plan and demonstrated an understanding of the instructions.  A copy of instructions were sent to the patient via MyChart.  The patient was advised to call back or seek an in-person evaluation if the symptoms worsen or if the condition fails to improve as anticipated.  Time:  I spent 12 minutes with the patient via telehealth technology discussing the above problems/concerns.    [name], PA-C

## 2021-04-15 NOTE — Patient Instructions (Signed)
Hughie Closs, thank you for joining [name], PA-C for today's virtual visit.  While this provider is not your primary care provider (PCP), if your PCP is located in our provider database this encounter information will be shared with them immediately following your visit.  Consent: (Patient) Marc Sandoval provided verbal consent for this virtual visit at the beginning of the encounter.  Current Medications:  Current Outpatient Medications:    ondansetron (ZOFRAN ODT) 4 MG disintegrating tablet, Take 1 tablet (4 mg total) by mouth every 8 (eight) hours as needed for nausea or vomiting., Disp: 20 tablet, Rfl: 0   fluticasone (FLONASE) 50 MCG/ACT nasal spray, Place 1-2 sprays into both nostrils daily., Disp: 16 g, Rfl: 0   hydrOXYzine (ATARAX/VISTARIL) 50 MG tablet, TAKE 1 TABLET (50 MG TOTAL) BY MOUTH EVERY 12 (TWELVE) HOURS AS NEEDED., Disp: 60 tablet, Rfl: 0   methocarbamol (ROBAXIN) 500 MG tablet, Take 1 tablet (500 mg total) by mouth 2 (two) times daily., Disp: 20 tablet, Rfl: 0   naproxen (NAPROSYN) 500 MG tablet, Take 1 tablet (500 mg total) by mouth 2 (two) times daily., Disp: 30 tablet, Rfl: 0   Medications ordered in this encounter:  Meds ordered this encounter  Medications   ondansetron (ZOFRAN ODT) 4 MG disintegrating tablet    Sig: Take 1 tablet (4 mg total) by mouth every 8 (eight) hours as needed for nausea or vomiting.    Dispense:  20 tablet    Refill:  0    Order Specific Question:   Supervising Provider    Answer:   Hyacinth Meeker, BRIAN [[id number]]     *If you need refills on other medications prior to your next appointment, please contact your pharmacy*  Follow-Up: Call back or seek an in-person evaluation if the symptoms worsen or if the condition fails to improve as anticipated.  Other Instructions Please keep well-hydrated and get plenty of rest.  Take the Zofran as directed for nausea.  As you get your appetite back and are tolerating the fluids well, please  start the diet below to ease back in to your regular diets.  If you become unable to tolerate fluids by mouth even with the nausea medicine, you need to be seen at the ER. Please do not delay care!   Bland Diet A bland diet consists of foods that are often soft and do not have a lot of fat, fiber, or extra seasonings. Foods without fat, fiber, or seasoning are easier for the body to digest. They are also less likely to irritate your mouth, throat, stomach, and other parts of your digestive system. A bland dietis sometimes called a BRAT diet. What is my plan? Your health care provider or food and nutrition specialist (dietitian) may recommend specific changes to your diet to prevent symptoms or to treat your symptoms. These changes may include: Eating small meals often. Cooking food until it is soft enough to chew easily. Chewing your food well. Drinking fluids slowly. Not eating foods that are very spicy, sour, or fatty. Not eating citrus fruits, such as oranges and grapefruit. What do I need to know about this diet? Eat a variety of foods from the bland diet food list. Do not follow a bland diet longer than needed. Ask your health care provider whether you should take vitamins or supplements. What foods can I eat? Grains  Hot cereals, such as cream of wheat. Rice. Bread, crackers, or tortillas madefrom refined white flour. Vegetables Canned or cooked vegetables. Mashed or  boiled potatoes. Fruits  Bananas. Applesauce. Other types of cooked or canned fruit with the skin andseeds removed, such as canned peaches or pears. Meats and other proteins  Scrambled eggs. Creamy peanut butter or other nut butters. Lean, well-cookedmeats, such as chicken or fish. Tofu. Soups or broths. Dairy Low-fat dairy products, such as milk, cottage cheese, or yogurt. Beverages  Water. Herbal tea. Apple juice. Fats and oils Mild salad dressings. Canola or olive oil. Sweets and desserts Pudding. Custard.  Fruit gelatin. Ice cream. The items listed above may not be a complete list of recommended foods and beverages. Contact a dietitian for more options. What foods are not recommended? Grains Whole grain breads and cereals. Vegetables Raw vegetables. Fruits Raw fruits, especially citrus, berries, or dried fruits. Dairy Whole fat dairy foods. Beverages Caffeinated drinks. Alcohol. Seasonings and condiments Strongly flavored seasonings or condiments. Hot sauce. Salsa. Other foods Spicy foods. Fried foods. Sour foods, such as pickled or fermented foods. Foodswith high sugar content. Foods high in fiber. The items listed above may not be a complete list of foods and beverages to avoid. Contact a dietitian for more information. Summary A bland diet consists of foods that are often soft and do not have a lot of fat, fiber, or extra seasonings. Foods without fat, fiber, or seasoning are easier for the body to digest. Check with your health care provider to see how long you should follow this diet plan. It is not meant to be followed for long periods. This information is not intended to replace advice given to you by your health care provider. Make sure you discuss any questions you have with your healthcare provider. Document Revised: 09/19/2017 Document Reviewed: 09/19/2017 Elsevier Patient Education  2022 [employer].    If you have been instructed to have an in-person evaluation today at a local Urgent Care facility, please use the link below. It will take you to a list of all of our available [facility], including address, phone number and hours of operation. Please do not delay care.  [facility]  If you or a family member do not have a primary care provider, use the link below to schedule a visit and establish care. When you choose a [facility] primary care physician or advanced practice provider, you gain a long-term partner in health. Find a Primary Care  Provider  Learn more about [facility] in-office and virtual care options: [facility] - Get Care Now

## 2021-04-16 ENCOUNTER — Telehealth: Payer: Medicaid Other | Admitting: Nurse Practitioner

## 2021-04-16 DIAGNOSIS — R110 Nausea: Secondary | ICD-10-CM

## 2021-04-16 DIAGNOSIS — F4310 Post-traumatic stress disorder, unspecified: Secondary | ICD-10-CM

## 2021-04-16 DIAGNOSIS — S40862A Insect bite (nonvenomous) of left upper arm, initial encounter: Secondary | ICD-10-CM

## 2021-04-16 DIAGNOSIS — L089 Local infection of the skin and subcutaneous tissue, unspecified: Secondary | ICD-10-CM

## 2021-04-16 DIAGNOSIS — Z9289 Personal history of other medical treatment: Secondary | ICD-10-CM

## 2021-04-16 DIAGNOSIS — W57XXXA Bitten or stung by nonvenomous insect and other nonvenomous arthropods, initial encounter: Secondary | ICD-10-CM

## 2021-04-16 DIAGNOSIS — S40262A Insect bite (nonvenomous) of left shoulder, initial encounter: Secondary | ICD-10-CM

## 2021-04-16 NOTE — Progress Notes (Signed)
Virtual Visit Consent   Marc Sandoval, you are scheduled for a virtual visit with Mary-Margaret Daphine Deutscher, FNP, a [facility] provider, today.     Just as with appointments in the office, your consent must be obtained to participate.  Your consent will be active for this visit and any virtual visit you may have with one of our providers in the next 365 days.     If you have a MyChart account, a copy of this consent can be sent to you electronically.  All virtual visits are billed to your insurance company just like a traditional visit in the office.    As this is a virtual visit, video technology does not allow for your provider to perform a traditional examination.  This may limit your provider's ability to fully assess your condition.  If your provider identifies any concerns that need to be evaluated in person or the need to arrange testing (such as labs, EKG, etc.), we will make arrangements to do so.     Although advances in technology are sophisticated, we cannot ensure that it will always work on either your end or our end.  If the connection with a video visit is poor, the visit may have to be switched to a telephone visit.  With either a video or telephone visit, we are not always able to ensure that we have a secure connection.     I need to obtain your verbal consent now.   Are you willing to proceed with your visit today? YES   Marc Sandoval has provided verbal consent on 04/16/2021 for a virtual visit (video or telephone).   Mary-Margaret Daphine Deutscher, FNP   Date: 04/16/2021 6:20 PM   Virtual Visit via Video Note   I, Mary-Margaret Daphine Deutscher, connected with Marc Sandoval ([id number], [birth year]) on 04/16/21 at  6:30 PM EDT by a video-enabled telemedicine application and verified that I am speaking with the correct person using two identifiers.  Location: Patient: Virtual Visit Location Patient: Home Provider: Virtual Visit Location Provider: Mobile   I discussed the limitations of  evaluation and management by telemedicine and the availability of in person appointments. The patient expressed understanding and agreed to proceed.    History of Present Illness: Marc Sandoval is a 30 y.o. who identifies as a male who was assigned male at birth, and is being seen today for nausea and anxiety.  [id number] scheduled video visit with 2 complaints: -nausea- patient said he had food from a [employer] He got very sick after eating it and was vomiting for 2 days. He dd a viideo visit with urgent care and they gave him meds and he is better now. - lump- he found a lump on his left upper inner arm. About the sie of a dime. He says it is sore  to the touch. No reddness and no drainage. Has been there for 2 day as now. It has actually gotten smaller.. - PTSD- he has been on hydroxyine for this and has been getting it from urgent care.  He says it is not helping him at all.It Is not in his chart as to where he has gotten some lately. They told him he would have to be seen by a PCP in order to get any othe rmeds. 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 He has been having tremors and is not able to sleep and is having headaches and neck pain from the shaking.  Problems:  Patient Active Problem List   Diagnosis  Date Noted   Gonorrhea 03/31/2021   On pre-exposure prophylaxis for HIV 03/31/2021   Generalized anxiety disorder 10/15/2020   PTSD (post-traumatic stress disorder) 10/15/2020   Major depressive disorder, recurrent episode, moderate (HCC) 09/06/2020   History of substance abuse (HCC) 09/06/2020   Panic attack as reaction to stress 09/06/2020   Vitamin D deficiency 02/06/2020   Closed fracture of left ankle 10/13/2018    Allergies:  Allergies  Allergen Reactions   Dayquil [Pseudoephedrine-Apap-Dm] Hives    Reports cause hives, sweats, cold symptoms   Doxylamine-Phenylephrine-Apap Hives   Doxylamine-Phenylephrine-Apap Hives   Nyquil Hbp Cold & Flu  [Dm-Doxylamine-Acetaminophen] Hives    Reports cause hives, sweats, cold symptoms   Vancomycin    Medications:  Current Outpatient Medications:    fluticasone (FLONASE) 50 MCG/ACT nasal spray, Place 1-2 sprays into both nostrils daily., Disp: 16 g, Rfl: 0   hydrOXYzine (ATARAX/VISTARIL) 50 MG tablet, TAKE 1 TABLET (50 MG TOTAL) BY MOUTH EVERY 12 (TWELVE) HOURS AS NEEDED., Disp: 60 tablet, Rfl: 0   methocarbamol (ROBAXIN) 500 MG tablet, Take 1 tablet (500 mg total) by mouth 2 (two) times daily., Disp: 20 tablet, Rfl: 0   naproxen (NAPROSYN) 500 MG tablet, Take 1 tablet (500 mg total) by mouth 2 (two) times daily., Disp: 30 tablet, Rfl: 0   ondansetron (ZOFRAN ODT) 4 MG disintegrating tablet, Take 1 tablet (4 mg total) by mouth every 8 (eight) hours as needed for nausea or vomiting., Disp: 20 tablet, Rfl: 0  Observations/Objective: Patient is well-developed, well-nourished in no acute distress.  Resting comfortably  at home.  Head is normocephalic, atraumatic.  No labored breathing.  Speech is clear and coherent with logical content.  Patient is alert and oriented at baseline.  Dime size raised lesion on medial surface of left upper arm. No erythema no edema, no drainage.  Assessment and Plan:  Marc Sandoval in today with chief complaint of Anxiety and Nausea   1. Nausea Patient states he has meds for this and doe snit need anything from me.  2. PTSD (post-traumatic stress disorder) He wanted meds for his anxiety , other than hydroxyzine that is not working. He was told he would need to do a face to face visit in order to be evaluated properly  3. S/P alcohol detoxification Wanted meds for his detox. Told could not do that in a video visit. Patient was not happy with that answer. Was told if detoxis that bad he needs in huse detox where he can be monitored better.  4. Nonvenomous bug bite of shoulder or upper arm, infected, left, initial encounter Does not appear to be an  infectious bite. He was told to not pick or scratch at area and should resolve on its own.        Follow Up Instructions: I discussed the assessment and treatment plan with the patient. The patient was provided an opportunity to ask questions and all were answered. The patient agreed with the plan and demonstrated an understanding of the instructions.  A copy of instructions were sent to the patient via MyChart.  The patient was advised to call back or seek an in-person evaluation if the symptoms worsen or if the condition fails to improve as anticipated.  Time:  I spent 20 minutes with the patient via telehealth technology discussing the above problems/concerns.    Mary-Margaret Daphine Deutscher, FNP

## 2021-04-24 ENCOUNTER — Other Ambulatory Visit: Payer: Self-pay

## 2021-04-24 ENCOUNTER — Ambulatory Visit (HOSPITAL_COMMUNITY)
Admission: EM | Admit: 2021-04-24 | Discharge: 2021-04-24 | Disposition: A | Discharge status: Home / Self Care | Payer: Medicaid Other | Attending: Emergency Medicine | Admitting: Emergency Medicine

## 2021-04-24 ENCOUNTER — Encounter (HOSPITAL_COMMUNITY): Payer: Self-pay

## 2021-04-24 DIAGNOSIS — Z20822 Contact with and (suspected) exposure to covid-19: Secondary | ICD-10-CM | POA: Insufficient documentation

## 2021-04-24 DIAGNOSIS — J069 Acute upper respiratory infection, unspecified: Secondary | ICD-10-CM

## 2021-04-24 MED ORDER — GUAIFENESIN ER 600 MG PO TB12: 600.0000 mg | ORAL_TABLET | Freq: Two times a day (BID) | ORAL | 0 refills | Status: DC

## 2021-04-24 MED ORDER — BENZONATATE 100 MG PO CAPS: 100.0000 mg | ORAL_CAPSULE | Freq: Three times a day (TID) | ORAL | 0 refills | Status: DC

## 2021-04-24 NOTE — ED Provider Notes (Signed)
[facility]    CSN: [id number] Arrival date & time: 04/24/21  1440      History   Chief Complaint Chief Complaint  Patient presents with  . URI    HPI Marc Sandoval is a 30 y.o. male.   Patient presents with nasal congestion, rhinorrhea, productive cough, chest soreness, sore throat, body aches, chills for 3 days. Denies fever, shortness of breath, wheezing, abdominal pain, nausea, diarrhea, vomiting, ear pain, headache. Vaccinated, possible sick contact. Taking supplements with no relief. Home covid test negative  Past Medical History:  Diagnosis Date  . Anxiety   . Depression   . Hypertension   . Migraines   . PTSD (post-traumatic stress disorder)     Patient Active Problem List   Diagnosis Date Noted  . Gonorrhea 03/31/2021  . On pre-exposure prophylaxis for HIV 03/31/2021  . Generalized anxiety disorder 10/15/2020  . PTSD (post-traumatic stress disorder) 10/15/2020  . Major depressive disorder, recurrent episode, moderate (HCC) 09/06/2020  . History of substance abuse (HCC) 09/06/2020  . Panic attack as reaction to stress 09/06/2020  . Vitamin D deficiency 02/06/2020  . Closed fracture of left ankle 10/13/2018    Past Surgical History:  Procedure Laterality Date  . OTHER SURGICAL HISTORY     pt reports head surgery in past  . skin graft on back         Home Medications    Prior to Admission medications   Medication Sig Start Date End Date Taking? Authorizing Provider  benzonatate (TESSALON) 100 MG capsule Take 1 capsule (100 mg total) by mouth every 8 (eight) hours. 04/24/21  Yes Ziggy Reveles, Elita Boone, NP  guaiFENesin (MUCINEX) 600 MG 12 hr tablet Take 1 tablet (600 mg total) by mouth 2 (two) times daily. 04/24/21  Yes Aprille Sawhney R, NP  fluticasone (FLONASE) 50 MCG/ACT nasal spray Place 1-2 sprays into both nostrils daily. 01/03/21   Wieters, Hallie C, PA-C  hydrOXYzine (ATARAX/VISTARIL) 50 MG tablet TAKE 1 TABLET (50 MG TOTAL) BY MOUTH EVERY 12  (TWELVE) HOURS AS NEEDED. 09/01/20 09/01/21  Hoy Register, MD  methocarbamol (ROBAXIN) 500 MG tablet Take 1 tablet (500 mg total) by mouth 2 (two) times daily. 02/26/21   Rushie Chestnut, PA-C  naproxen (NAPROSYN) 500 MG tablet Take 1 tablet (500 mg total) by mouth 2 (two) times daily. 01/03/21   Wieters, Hallie C, PA-C  ondansetron (ZOFRAN ODT) 4 MG disintegrating tablet Take 1 tablet (4 mg total) by mouth every 8 (eight) hours as needed for nausea or vomiting. 04/15/21   Waldon Merl, PA-C  dicyclomine (BENTYL) 20 MG tablet Take 1 tablet (20 mg total) by mouth 2 (two) times daily. 06/20/19 01/31/20  Cathie Hoops, Amy V, PA-C  PARoxetine (PAXIL) 20 MG tablet Take 1 tablet (20 mg total) by mouth daily. 08/04/20 08/31/20  Hoy Register, MD    Family History Family History  Problem Relation Age of Onset  . Cancer Father   . Healthy Daughter   . Heart disease Maternal Grandmother   . Cancer Paternal Grandmother   . Aneurysm Maternal Aunt   . Heart attack Maternal Aunt   . Cirrhosis Paternal Grandfather   . Cirrhosis Maternal Uncle   . Cancer Maternal Uncle   . HIV/AIDS Maternal Uncle   . Stroke Neg Hx     Social History Social History   Tobacco Use  . Smoking status: Never  . Smokeless tobacco: Never  Vaping Use  . Vaping Use: Never used  Substance Use  Topics  . Alcohol use: Yes    Comment: occ  . Drug use: Yes    Types: Marijuana    Comment: occasionally smokes marijuana     Allergies   Dayquil [pseudoephedrine-apap-dm], Doxylamine-phenylephrine-apap, Doxylamine-phenylephrine-apap, Nyquil hbp cold & flu [dm-doxylamine-acetaminophen], and Vancomycin   Review of Systems Review of Systems Defer to HPI    Physical Exam Triage Vital Signs ED Triage Vitals  Enc Vitals Group     BP 04/24/21 1536 (!) 139/95     Pulse Rate 04/24/21 1536 98     Resp 04/24/21 1536 17     Temp 04/24/21 1536 98.4 F (36.9 C)     Temp Source 04/24/21 1536 Oral     SpO2 04/24/21 1536 97 %      Weight --      Height --      Head Circumference --      Peak Flow --      Pain Score 04/24/21 1548 6     Pain Loc --      Pain Edu? --      Excl. in GC? --    No data found.  Updated Vital Signs BP (!) 139/95 (BP Location: Left Arm)   Pulse 98   Temp 98.4 F (36.9 C) (Oral)   Resp 17   SpO2 97%   Visual Acuity Right Eye Distance:   Left Eye Distance:   Bilateral Distance:    Right Eye Near:   Left Eye Near:    Bilateral Near:     Physical Exam Constitutional:      Appearance: Normal appearance. He is normal weight.  HENT:     Head: Normocephalic.     Right Ear: Tympanic membrane, ear canal and external ear normal.     Left Ear: Tympanic membrane, ear canal and external ear normal.     Nose: Congestion and rhinorrhea present.     Mouth/Throat:     Mouth: Mucous membranes are moist.     Pharynx: Posterior oropharyngeal erythema present. No oropharyngeal exudate.  Eyes:     Extraocular Movements: Extraocular movements intact.  Cardiovascular:     Rate and Rhythm: Normal rate and regular rhythm.     Pulses: Normal pulses.     Heart sounds: Normal heart sounds.  Pulmonary:     Effort: Pulmonary effort is normal.     Breath sounds: Normal breath sounds.  Abdominal:     General: Abdomen is flat. Bowel sounds are normal.     Palpations: Abdomen is soft.  Musculoskeletal:        General: Normal range of motion.     Cervical back: Normal range of motion.  Lymphadenopathy:     Cervical: Cervical adenopathy present.  Skin:    General: Skin is warm and dry.  Neurological:     Mental Status: He is alert and oriented to person, place, and time. Mental status is at baseline.  Psychiatric:        Mood and Affect: Mood normal.        Behavior: Behavior normal.     UC Treatments / Results  Labs (all labs ordered are listed, but only abnormal results are displayed) Labs Reviewed  SARS CORONAVIRUS 2 (TAT 6-24 HRS)    EKG   Radiology No results  found.  Procedures Procedures (including critical care time)  Medications Ordered in UC Medications - No data to display  Initial Impression / Assessment and Plan / UC Course  I have reviewed the  triage vital signs and the nursing notes.  Pertinent labs & imaging results that were available during my care of the patient were reviewed by me and considered in my medical decision making (see chart for details).  Viral URI with cough  Covid test pending, requested retest Tessalon 100 mg tid prn Mucinex 600 mg bid prn Advised otc medications for remaining symptoms  Final Clinical Impressions(s) / UC Diagnoses   Final diagnoses:  Viral URI with cough     Discharge Instructions      Covid test pending 24 hours   Can take tessalon every 8 hours as needed for cough  Can take mucinex twice a day for congestion   Can use benadryl 25-50 mg at bedtime to help with sleep   We will contact you if your COVID test is positive.  Please quarantine while you wait for the results.  If your test is negative you may resume normal activities.  If your test is positive please continue to quarantine for at least 5 days from your symptom onset or until you are without a fever for at least 24 hours after the medications.    You can take Tylenol  650 mg and/or Ibuprofen 600 mg every 6 hours as needed for fever reduction and pain relief.   For cough: honey 1/2 to 1 teaspoon (you can dilute the honey in water or another fluid).  You can also use guaifenesin and dextromethorphan for cough. You can use a humidifier for chest congestion and cough.  If you don't have a humidifier, you can sit in the bathroom with the hot shower running.      For sore throat: try warm salt water gargles, cepacol lozenges, throat spray, warm tea or water with lemon/honey, popsicles or ice, or OTC cold relief medicine for throat discomfort.   For congestion: take a daily anti-histamine like Zyrtec, Claritin, and a oral  decongestant, such as pseudoephedrine.  You can also use Flonase 1-2 sprays in each nostril daily.   It is important to stay hydrated: drink plenty of fluids (water, gatorade/powerade/pedialyte, juices, or teas) to keep your throat moisturized and help further relieve irritation/discomfort.          ED Prescriptions     Medication Sig Dispense Auth. Provider   guaiFENesin (MUCINEX) 600 MG 12 hr tablet Take 1 tablet (600 mg total) by mouth 2 (two) times daily. 20 tablet Amairani Shuey R, NP   benzonatate (TESSALON) 100 MG capsule Take 1 capsule (100 mg total) by mouth every 8 (eight) hours. 21 capsule Trayven Lumadue, Elita Boone, NP      PDMP not reviewed this encounter.   Valinda Hoar, [place] 04/24/21 [phone]

## 2021-04-24 NOTE — Discharge Instructions (Addendum)
Covid test pending 24 hours   Can take tessalon every 8 hours as needed for cough  Can take mucinex twice a day for congestion   Can use benadryl 25-50 mg at bedtime to help with sleep   We will contact you if your COVID test is positive.  Please quarantine while you wait for the results.  If your test is negative you may resume normal activities.  If your test is positive please continue to quarantine for at least 5 days from your symptom onset or until you are without a fever for at least 24 hours after the medications.    You can take Tylenol  650 mg and/or Ibuprofen 600 mg every 6 hours as needed for fever reduction and pain relief.   For cough: honey 1/2 to 1 teaspoon (you can dilute the honey in water or another fluid).  You can also use guaifenesin and dextromethorphan for cough. You can use a humidifier for chest congestion and cough.  If you don't have a humidifier, you can sit in the bathroom with the hot shower running.      For sore throat: try warm salt water gargles, cepacol lozenges, throat spray, warm tea or water with lemon/honey, popsicles or ice, or OTC cold relief medicine for throat discomfort.   For congestion: take a daily anti-histamine like Zyrtec, Claritin, and a oral decongestant, such as pseudoephedrine.  You can also use Flonase 1-2 sprays in each nostril daily.   It is important to stay hydrated: drink plenty of fluids (water, gatorade/powerade/pedialyte, juices, or teas) to keep your throat moisturized and help further relieve irritation/discomfort.

## 2021-04-24 NOTE — ED Triage Notes (Signed)
Pt presents with cough, bilateral ear pain, sore throat, and generalized body pain X 3 days.

## 2021-04-25 LAB — SARS CORONAVIRUS 2 (TAT 6-24 HRS): SARS Coronavirus 2: NEGATIVE
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
## 2021-04-27 ENCOUNTER — Encounter: Payer: Self-pay | Admitting: Family Medicine

## 2021-04-27 ENCOUNTER — Other Ambulatory Visit: Payer: Self-pay

## 2021-04-27 ENCOUNTER — Ambulatory Visit (INDEPENDENT_AMBULATORY_CARE_PROVIDER_SITE_OTHER): Payer: Self-pay | Admitting: Family Medicine

## 2021-04-27 VITALS — BP 121/89 | HR 72 | Ht 76.0 in | Wt 164.6 lb

## 2021-04-27 DIAGNOSIS — Z0000 Encounter for general adult medical examination without abnormal findings: Secondary | ICD-10-CM

## 2021-04-27 DIAGNOSIS — F4310 Post-traumatic stress disorder, unspecified: Secondary | ICD-10-CM

## 2021-04-27 MED ORDER — SERTRALINE HCL 25 MG PO TABS: 25.0000 mg | ORAL_TABLET | Freq: Every day | ORAL | 0 refills | Status: DC

## 2021-04-27 NOTE — Patient Instructions (Addendum)
It was great meeting you today!  Today you came in to establish care. We did a physical exam and I am referring you to psychiatry and we also started you on low dose Zoloft which we can increase depending on how you tolerate the medication.   Please bring your COVID card next time so we can input those dates.   I have provided you with psych resource list as well as a list of dentists you can call to schedule an appointment.   Please check-out at the front desk before leaving the clinic. I'd like to see you back in 2-3 weeks to follow up on anxiety and depression and new medication, but if you need to be seen earlier than that for any new issues we're happy to fit you in, just give [place] a call!  Visit Reminders: - Stop by the pharmacy to pick up your prescriptions   Feel free to call with any questions or concerns at [phone].   Take care,  Dr. Cora Collum [facility]     Psychiatry Resource List (Adults and Children) Most of these providers will take Medicaid. please consult your insurance for a complete and updated list of available providers. When calling to make an appointment have your insurance information available to confirm you are covered.   [facility]:Psychiatry and Counseling [address] [place], [place] [address] [phone]  [facility]  [address] [place], [place] [phone] Crisis [phone]   [facility]:   [facility]: [address]     [phone]   [facility]: [address] [place]. [place],        [phone] [place]: [address] [phone],    [phone] [place]: [phone] [address],                   [phone] Children: [facility] [address]         [phone]  [facility] (virtual only) [phone]  [facility]  (Psychiatry only; Adults /children 12 and  over, will take Medicaid)  [address] Laurell Josephs [address], [place], [place] [address]       [phone]   [facility] (Psychiatry & counseling ; adults & children ; will take Medicaid [address]  [place] [address]  Go on-line to complete referral ( [email or web address]://[email or web address].org/en/make-[email or web address]-referral [phone]    (Spanish speaking therapists)  [facility]  Psychiatry & counseling; Adults and children;  Call Registration prior to scheduling an appointment [phone] [address]    [place], [place] [address]    [phone]  [facility] (Psychiatry & counseling; adults & children; Medicare no Medicaid)  [address]   [place], [place]  [address]      [phone]    [facility] (up to age 80)  Psychiatry & counseling ,will take Medicaid, must do counseling to receive psychiatry services  [address] [place] [address]        [phone]  [facility] (Psychiatry & counseling; adults & children; will take Medicaid) Will need a referral from provider [address],  [place], [place]  [phone]   [facility] --- Walk-In Mon-Friday 8am-3pm ( will take Medicaid, Psychiatry, Adults & children,  [address] [place], [place]   [phone]   [facility] [place]--, Walk-in M-F 8am-12pm and 1pm -3pm   (Counseling, Psychiatry, will take Medicaid, adults & children)  [address] [place], [place]  [phone]       If you are feeling suicidal or depression symptoms worsen please immediately go to:   If you are thinking about harming yourself or having thoughts of suicide, or if you know someone who is, seek help right away. If you are in crisis, make sure you are not left alone.  If someone else is in crisis, make sure he/she/they is not left alone  Call 988 OR 1-800-273-TALK  24 Hour Availability for Walk-IN services  [facility]  [address] [place], [place] [facility] [place] [phone] Crisis [phone]    Other crisis resources:  [facility] [employer] (Domestic Violence, Rape & Victim Assistance [phone]  [facility] [employer] [facility]    (ONLY from 8am-4pm)    [phone]  [facility] Mobile Crisis Unit (24/7)   [phone]  [place] National Suicide Hotline   [phone] Len Childs)

## 2021-05-04 NOTE — Addendum Note (Signed)
Addended by: Cora Collum on: 05/04/2021 09:06 PM   Modules accepted: Orders
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
## 2021-05-05 ENCOUNTER — Telehealth: Payer: Self-pay | Admitting: *Deleted

## 2021-05-05 NOTE — Chronic Care Management (AMB) (Signed)
  Care Management   Note  05/05/2021 Name: [name] MRN: [id number] DOB: [birth year]  Marc Sandoval is a 30 y.o. year old male who is a primary care patient of Cora Collum, DO. I reached out to Hughie Closs by phone today in response to a referral sent by Mr. Nyan Dufresne health plan.    Mr. Viner was given information about care management services today including:  Care management services include personalized support from designated clinical staff supervised by his physician, including individualized plan of care and coordination with other care providers 24/7 contact phone numbers for assistance for urgent and routine care needs. The patient may stop care management services at any time by phone call to the office staff.  Patient agreed to services and verbal consent obtained.   Follow up plan: Telephone appointment with care management team member scheduled for:05/10/21  [facility] Guide, Embedded Care Coordination [facility]  Care Management  Direct Dial: [phone]

## 2021-05-06 ENCOUNTER — Telehealth: Payer: Self-pay

## 2021-05-06 NOTE — Telephone Encounter (Signed)
Patient calls nurse line stating he has an apt on 9/14, however he is needing something for sleep. Patient reports he feels he is "detoxing," and he has been unable to sleep. Patient is reporting tremors, however states they only happen at night. Patient denies nausea, vomiting or excessive sweating. Patient advised to drink plenty of fluids. Patient requesting any advice from PCP or medications.

## 2021-05-10 ENCOUNTER — Ambulatory Visit: Payer: Medicaid Other | Admitting: Licensed Clinical Social Worker

## 2021-05-10 DIAGNOSIS — Z7189 Other specified counseling: Secondary | ICD-10-CM
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
## 2021-05-10 NOTE — Patient Instructions (Addendum)
Licensed Clinical Social Worker Visit Information  Goals we discussed today:   Goals Addressed             This Visit's Progress    Find Help in My Community       Timeframe:  Short-Term Goal Priority:  High Start Date:                             Expected End Date:                       Follow Up Date 05/31/21     Patient Goals/Self-Care Activities: Over the next 21 days Call [facility] [phone]  to schedule your counseling appointment. Continue with therapy Continue with compliance of taking medication  Call [facility] to help with locating employment     Why is this important?   Knowing how and where to find help for yourself or family in your neighborhood and          Marc Sandoval was given information about Care Management services today including:  Care Management services include personalized support from designated clinical staff supervised by his physician, including individualized plan of care and coordination with other care providers 24/7 contact phone numbers for assistance for urgent and routine care needs. The patient may stop Care Management services at any time by phone call to the office staff.   Patient agreed to services and verbal consent obtained.   Patient verbalizes understanding of instructions provided today and agrees to view in MyChart.   Follow up plan: Appointment scheduled for SW follow up with client by phone on: 05/31/21   Marc Hines, LCSW Care Management & Coordination  [facility] / [facility]   [phone] 4:56 PM

## 2021-05-10 NOTE — Telephone Encounter (Signed)
Patient calls nurse line returning PCP phone call. Patient reports he is feeling better and has been sleeping. Patient plans to followup on 9/14 with PCP.

## 2021-05-12 ENCOUNTER — Telehealth: Payer: Self-pay | Admitting: *Deleted

## 2021-05-12 NOTE — Chronic Care Management (AMB) (Signed)
  Care Management   Note  05/12/2021 Name: Marc Sandoval MRN: [id number] DOB: Sep 25, 1990  Marc Sandoval is a 30 y.o. year old male who is a primary care patient of Cora Collum, DO and is actively engaged with the care management team. I reached out to Hughie Closs by phone today to assist with re-scheduling a follow up visit with the Licensed Clinical Social Worker  Follow up plan: Unsuccessful telephone outreach attempt made. A HIPAA compliant phone message was left for the patient providing contact information and requesting a return call. The care management team will reach out to the patient again over the next 7 days. If patient returns call to provider office, please advise to call Embedded Care Management Care Guide Misty Stanley at [phone].  Gwenevere Ghazi  Care Guide, Embedded Care Coordination [facility] Management  Direct Dial: [phone]

## 2021-05-13 ENCOUNTER — Telehealth: Payer: Self-pay | Admitting: *Deleted

## 2021-05-13 NOTE — Telephone Encounter (Signed)
Patient calls to inform triage that he will no longer be a patient here and canceled all his appts.  He was belligerent with the CMA and called her a disrespectful name.  Pt has been removed from our panel, at his request, and since he was abusive towards staff and Provider he will not be allowed to reestablish care.  Jone Baseman, CMA

## 2021-05-17 ENCOUNTER — Telehealth: Payer: Self-pay | Admitting: *Deleted

## 2021-05-17 ENCOUNTER — Ambulatory Visit: Payer: Medicaid Other | Admitting: Pharmacist

## 2021-05-17 NOTE — Telephone Encounter (Signed)
I called Marc Sandoval after getting permission from Cassie to see if he was interested in the Purpose 2 prep study. He asked me why I was asking him about Prep and I said since he was coming in tomorrow for a Prep visit I thought he might be interested. He then said he was not sexually active and didn't need it but wanted to get tested for STDs. I told him there wasn't a need to get tested every 3 months if he wasn't sexually active. Then he told me I was condescending and shouldn't be asking him about that. I told him I was sorry if I upset him, then he hung up the phone.

## 2021-05-17 NOTE — Telephone Encounter (Signed)
I called Marc Sandoval after getting permission from Cassie to see if he was interested in the Purpose 2 prep study. He asked me why I was asking him about Prep and I said since he was coming in tomorrow for a Prep visit I thought he might be interested. He then said he was not sexually active and didn't need it but wanted to get tested for STDs. I told him there wasn't a need to get tested every 3 months if he wasn't sexually active. Then he told me I was condescending and shouldn't be asking him about that. I told him I was sorry if I upset him, then he hung up the phone. 

## 2021-05-18 ENCOUNTER — Ambulatory Visit: Payer: Medicaid Other | Admitting: Pharmacist

## 2021-05-18 ENCOUNTER — Ambulatory Visit: Payer: Medicaid Other | Admitting: Family Medicine

## 2021-05-19 NOTE — Chronic Care Management (AMB) (Signed)
  Care Management   Note  05/19/2021 Name: Marc Sandoval MRN: [id number] DOB: [birth year]  Marc Sandoval is a 31 y.o. year old male who is a primary care patient of No primary care provider on file. and is actively engaged with the care management team. I reached out to Hughie Closs by phone today to assist with re-scheduling a follow up visit with the Licensed Clinical Social Worker  Follow up plan: A telephone outreach attempt made. The care management team will reach out to the patient again over the next 7 days. If patient returns call to provider office, please advise to call Embedded Care Management Care Guide Misty Stanley at [phone].  Gwenevere Ghazi  Care Guide, Embedded Care Coordination [facility] Management  Direct Dial: [phone]

## 2021-05-27 NOTE — Progress Notes (Signed)
Patient is no longer apart of practice.  Gwenevere Ghazi  Care Guide, Embedded Care Coordination [facility] Management  Direct Dial: [phone]

## 2021-05-31 ENCOUNTER — Telehealth: Payer: Medicaid Other

## 2021-06-07 ENCOUNTER — Other Ambulatory Visit (HOSPITAL_COMMUNITY): Payer: Self-pay

## 2021-07-19 ENCOUNTER — Emergency Department (HOSPITAL_COMMUNITY)
Admission: EM | Admit: 2021-07-19 | Discharge: 2021-07-19 | Disposition: A | Discharge status: Home / Self Care | Payer: Medicaid Other | Attending: Student | Admitting: Student

## 2021-07-19 ENCOUNTER — Encounter (HOSPITAL_COMMUNITY): Payer: Self-pay

## 2021-07-19 DIAGNOSIS — M791 Myalgia, unspecified site: Secondary | ICD-10-CM | POA: Insufficient documentation

## 2021-07-19 DIAGNOSIS — Z5321 Procedure and treatment not carried out due to patient leaving prior to being seen by health care provider: Secondary | ICD-10-CM | POA: Insufficient documentation

## 2021-07-19 DIAGNOSIS — R1110 Vomiting, unspecified: Secondary | ICD-10-CM | POA: Insufficient documentation

## 2021-07-19 MED ORDER — ONDANSETRON 4 MG PO TBDP
4.0000 mg | ORAL_TABLET | Freq: Once | ORAL | Status: DC
  Filled 2021-07-19: qty 1

## 2021-07-19 NOTE — ED Notes (Signed)
No answer for vitals  

## 2021-07-19 NOTE — ED Triage Notes (Signed)
Pt presents with c/o vomiting and body aches since Friday night.

## 2021-09-29 ENCOUNTER — Encounter (HOSPITAL_COMMUNITY): Payer: Self-pay

## 2021-09-29 ENCOUNTER — Ambulatory Visit (HOSPITAL_COMMUNITY)
Admission: EM | Admit: 2021-09-29 | Discharge: 2021-09-29 | Disposition: A | Discharge status: Home / Self Care | Payer: Self-pay | Attending: Family Medicine | Admitting: Family Medicine

## 2021-09-29 ENCOUNTER — Other Ambulatory Visit: Payer: Self-pay

## 2021-09-29 DIAGNOSIS — F339 Major depressive disorder, recurrent, unspecified: Secondary | ICD-10-CM | POA: Insufficient documentation

## 2021-09-29 DIAGNOSIS — F411 Generalized anxiety disorder: Secondary | ICD-10-CM | POA: Insufficient documentation

## 2021-09-29 DIAGNOSIS — L659 Nonscarring hair loss, unspecified: Secondary | ICD-10-CM | POA: Insufficient documentation

## 2021-09-29 DIAGNOSIS — R634 Abnormal weight loss: Secondary | ICD-10-CM | POA: Insufficient documentation

## 2021-09-29 DIAGNOSIS — Z113 Encounter for screening for infections with a predominantly sexual mode of transmission: Secondary | ICD-10-CM | POA: Insufficient documentation

## 2021-09-29 LAB — HIV ANTIBODY (ROUTINE TESTING W REFLEX): HIV Screen 4th Generation wRfx: NONREACTIVE

## 2021-09-29 MED ORDER — HYDROXYZINE HCL 25 MG PO TABS: 25.0000 mg | ORAL_TABLET | Freq: Four times a day (QID) | ORAL | 0 refills | Status: AC

## 2021-09-29 NOTE — ED Triage Notes (Signed)
Pt presents with c/o hair loss.

## 2021-09-29 NOTE — ED Provider Notes (Signed)
[place]    CSN: [id number] Arrival date & time: 09/29/21  0803      History   Chief Complaint Chief Complaint  Patient presents with   SEXUALLY TRANSMITTED DISEASE    HPI Marc Sandoval is a 31 y.o. male.   Patient is here for several issues.  He is here primarily for anxiety.  It is "through the roof".  He is unable to sleep.  He has lost about 10 lbs.  He is also noting hair loss.  He does have a pcp, but that was not a good fit.  He is really not feeling good about the health care system at this time.  He really does not want to go back there and not ready to find another pcp at this time.   Coming here was even hard for him.  He was on zoloft in the past, but has not been on that since may of last year.  He did use hydroxyzine in the past,not sure if helpful.  At this time he is just using tylenol PM.   He would like to have an STI check.  He has not really been sexually active, but would like it checked.  He has been treated for gonorrhea.  He would like blood work as well.  No penile symptoms, but he just feels weird.  He is not sure if that is anxiety or not.   Past Medical History:  Diagnosis Date   Anxiety    Depression    Hypertension    Migraines    PTSD (post-traumatic stress disorder)     Patient Active Problem List   Diagnosis Date Noted   Gonorrhea 03/31/2021   On pre-exposure prophylaxis for HIV 03/31/2021   Generalized anxiety disorder 10/15/2020   PTSD (post-traumatic stress disorder) 10/15/2020   Major depressive disorder, recurrent episode, moderate ([place]) 09/06/2020   History of substance abuse ([place]) 09/06/2020   Panic attack as reaction to stress 09/06/2020   Vitamin D deficiency 02/06/2020   Closed fracture of left ankle 10/13/2018    Past Surgical History:  Procedure Laterality Date   OTHER SURGICAL HISTORY     pt reports head surgery in past   skin graft on back         Home Medications    Prior to Admission  medications   Medication Sig Start Date End Date Taking? Authorizing Provider  benzonatate (TESSALON) 100 MG capsule Take 1 capsule (100 mg total) by mouth every 8 (eight) hours. 04/24/21   White, Leitha Schuller, NP  fluticasone (FLONASE) 50 MCG/ACT nasal spray Place 1-2 sprays into both nostrils daily. 01/03/21   Wieters, Hallie C, PA-C  guaiFENesin (MUCINEX) 600 MG 12 hr tablet Take 1 tablet (600 mg total) by mouth 2 (two) times daily. 04/24/21   White, Leitha Schuller, NP  methocarbamol (ROBAXIN) 500 MG tablet Take 1 tablet (500 mg total) by mouth 2 (two) times daily. 02/26/21   Hughie Closs, PA-C  ondansetron (ZOFRAN ODT) 4 MG disintegrating tablet Take 1 tablet (4 mg total) by mouth every 8 (eight) hours as needed for nausea or vomiting. 04/15/21   Brunetta Jeans, PA-C  sertraline (ZOLOFT) 25 MG tablet Take 1 tablet (25 mg total) by mouth daily. 04/27/21   Shary Key, DO  dicyclomine (BENTYL) 20 MG tablet Take 1 tablet (20 mg total) by mouth 2 (two) times daily. 06/20/19 01/31/20  Tasia Catchings, Amy V, PA-C  PARoxetine (PAXIL) 20 MG tablet Take 1 tablet (20  mg total) by mouth daily. 08/04/20 08/31/20  Charlott Rakes, MD    Family History Family History  Problem Relation Age of Onset   Cancer Father    Healthy Daughter    Heart disease Maternal Grandmother    Cancer Paternal Grandmother    Aneurysm Maternal Aunt    Heart attack Maternal Aunt    Cirrhosis Paternal Grandfather    Cirrhosis Maternal Uncle    Cancer Maternal Uncle    HIV/AIDS Maternal Uncle    Stroke Neg Hx     Social History Social History   Tobacco Use   Smoking status: Never   Smokeless tobacco: Never  Vaping Use   Vaping Use: Never used  Substance Use Topics   Alcohol use: Yes    Comment: occ   Drug use: Yes    Types: Marijuana    Comment: occasionally smokes marijuana     Allergies   Dayquil [pseudoephedrine-apap-dm], Doxylamine-phenylephrine-apap, Doxylamine-phenylephrine-apap, Nyquil hbp cold & flu  [dm-doxylamine-acetaminophen], and Vancomycin   Review of Systems Review of Systems  Constitutional:  Positive for appetite change and unexpected weight change. Negative for chills and fever.  HENT: Negative.    Eyes: Negative.   Respiratory:  Positive for chest tightness.   Cardiovascular: Negative.   Gastrointestinal: Negative.   Endocrine: Negative.   Genitourinary: Negative.   Musculoskeletal: Negative.   Neurological: Negative.   Psychiatric/Behavioral:  Positive for sleep disturbance. The patient is nervous/anxious.     Physical Exam Triage Vital Signs ED Triage Vitals  Enc Vitals Group     BP 09/29/21 0835 (!) 148/104     Pulse Rate 09/29/21 0831 61     Resp 09/29/21 0831 16     Temp 09/29/21 0831 97.7 F (36.5 C)     Temp Source 09/29/21 0831 Oral     SpO2 09/29/21 0831 100 %     Weight --      Height --      Head Circumference --      Peak Flow --      Pain Score 09/29/21 0829 0     Pain Loc --      Pain Edu? --      Excl. in [place]? --    No data found.  Updated Vital Signs BP (!) 148/104    Pulse 61    Temp 97.7 F (36.5 C) (Oral)    Resp 16    SpO2 100%   Visual Acuity Right Eye Distance:   Left Eye Distance:   Bilateral Distance:    Right Eye Near:   Left Eye Near:    Bilateral Near:     Physical Exam Constitutional:      Appearance: Normal appearance.  HENT:     Head: Normocephalic and atraumatic.     Nose: Nose normal.  Cardiovascular:     Rate and Rhythm: Normal rate and regular rhythm.  Pulmonary:     Effort: Pulmonary effort is normal.     Breath sounds: Normal breath sounds.  Abdominal:     Palpations: Abdomen is soft.  Genitourinary:    Penis: Normal.   Musculoskeletal:        General: Normal range of motion.     Cervical back: Normal range of motion and neck supple.  Skin:    General: Skin is warm and dry.  Neurological:     General: No focal deficit present.     Mental Status: He is alert.  Psychiatric:  Mood and  Affect: Mood normal.     Comments: Slightly tearful      UC Treatments / Results  Labs (all labs ordered are listed, but only abnormal results are displayed) Labs Reviewed - No data to display  EKG   Radiology No results found.  Procedures Procedures (including critical care time)  Medications Ordered in UC Medications - No data to display  Initial Impression / Assessment and Plan / UC Course  I have reviewed the triage vital signs and the nursing notes.  Pertinent labs & imaging results that were available during my care of the patient were reviewed by me and considered in my medical decision making (see chart for details).   Patient is here for anxiety and depression.  This is a long standing problem that has worsened.  Long discussion today about the need to f/u with a pcp in this regard.  Today will trial hydroxyzine to see if helpful for anxiety and sleep.  He has used this in the past.  I think this is causing a majority of his other symptoms.  He was screened for sti's today per his request.  Those will be resulted tomorrow and will treat if needed.   Final Clinical Impressions(s) / UC Diagnoses   Final diagnoses:  Generalized anxiety disorder  Episode of recurrent major depressive disorder, unspecified depression episode severity (HCC)  Hair loss  Loss of weight  Routine screening for STI (sexually transmitted infection)     Discharge Instructions      You were seen today for varied symptoms.  I have sent out hydroxyzine for your anxiety.  As discussed, I think a majority of your symptoms are due to anxiety and depression.  You should find a primary care provider for further care and work up for this.  We have also screened for STIs per your request.  This will be resulted in 24-48 hrs.      ED Prescriptions     Medication Sig Dispense Auth. Provider   hydrOXYzine (ATARAX) 25 MG tablet Take 1 tablet (25 mg total) by mouth every 6 (six) hours. 12 tablet  Rondel Oh, MD      PDMP not reviewed this encounter.   Rondel Oh, MD 09/29/21 [phone]

## 2021-09-29 NOTE — Discharge Instructions (Signed)
You were seen today for varied symptoms.  I have sent out hydroxyzine for your anxiety.  As discussed, I think a majority of your symptoms are due to anxiety and depression.  You should find a primary care provider for further care and work up for this.  We have also screened for STIs per your request.  This will be resulted in 24-48 hrs.

## 2021-09-29 NOTE — ED Triage Notes (Signed)
Pt presents for STI screening.   Reports he wants to talk about his depression and anxiety. States he has lost 10 lbs. States he is aware he is in a depressive stage. Reports he is having a lot of headaches.

## 2021-09-30 LAB — CYTOLOGY, (ORAL, ANAL, URETHRAL) ANCILLARY ONLY
Chlamydia: NEGATIVE
Comment: NEGATIVE
Comment: NEGATIVE
Comment: NORMAL
Neisseria Gonorrhea: NEGATIVE
Trichomonas: NEGATIVE

## 2021-09-30 LAB — RPR: RPR Ser Ql: NONREACTIVE

## 2021-10-10 ENCOUNTER — Other Ambulatory Visit (HOSPITAL_COMMUNITY): Payer: Self-pay

## 2021-10-11 ENCOUNTER — Telehealth: Payer: Self-pay

## 2021-10-11 NOTE — Telephone Encounter (Signed)
Patient called asking for appt and to speak to director regarding dismissal. I explained that we can forward med records to his new ID clinic once we get the request-He stated he will file a lawsuit and hung up. Best boy.

## 2021-10-13 NOTE — Telephone Encounter (Signed)
Pt called schedulers to make an appt.  When informed that he told us he was no longer a patient and he has been removed he reports that "was not what he said" per scheduler.   Upon review of chart, pt communicated with staff in an inappropriate manner here and at other offices within the system.  Given the recent actions, pt will not be able to reestablish care since he reported leaving in 05/2021 of his own free will.  Attempted to call.  No answer and no machine. Jone Baseman, CMA

## 2021-10-27 ENCOUNTER — Other Ambulatory Visit (HOSPITAL_COMMUNITY): Payer: Self-pay

## 2021-10-27 MED ORDER — DESCOVY 200-25 MG PO TABS
1.0000 | ORAL_TABLET | Freq: Every day | ORAL | 2 refills | Status: DC
  Filled 2022-01-27 (×2): qty 30, 30d supply, fill #0
  Filled 2022-02-21 – 2022-03-21 (×3): qty 30, 30d supply, fill #1
  Filled 2022-05-13: qty 30, 30d supply, fill #2

## 2021-10-28 ENCOUNTER — Other Ambulatory Visit (HOSPITAL_COMMUNITY): Payer: Self-pay

## 2021-11-08 ENCOUNTER — Ambulatory Visit (HOSPITAL_BASED_OUTPATIENT_CLINIC_OR_DEPARTMENT_OTHER): Payer: Medicaid Other | Admitting: Nurse Practitioner

## 2021-11-08 ENCOUNTER — Other Ambulatory Visit (HOSPITAL_COMMUNITY): Payer: Self-pay

## 2021-11-11 ENCOUNTER — Encounter (HOSPITAL_COMMUNITY): Payer: Self-pay

## 2021-11-11 ENCOUNTER — Other Ambulatory Visit: Payer: Self-pay

## 2021-11-11 ENCOUNTER — Ambulatory Visit (HOSPITAL_COMMUNITY)
Admission: EM | Admit: 2021-11-11 | Discharge: 2021-11-11 | Disposition: A | Discharge status: Home / Self Care | Payer: Medicaid Other | Attending: Student | Admitting: Student

## 2021-11-11 DIAGNOSIS — Z113 Encounter for screening for infections with a predominantly sexual mode of transmission: Secondary | ICD-10-CM | POA: Insufficient documentation

## 2021-11-11 DIAGNOSIS — J029 Acute pharyngitis, unspecified: Secondary | ICD-10-CM | POA: Insufficient documentation

## 2021-11-11 DIAGNOSIS — Z112 Encounter for screening for other bacterial diseases: Secondary | ICD-10-CM | POA: Insufficient documentation

## 2021-11-11 LAB — HIV ANTIBODY (ROUTINE TESTING W REFLEX): HIV Screen 4th Generation wRfx: NONREACTIVE

## 2021-11-11 LAB — POCT RAPID STREP A, ED / UC: Streptococcus, Group A Screen (Direct): NEGATIVE

## 2021-11-11 MED ORDER — CEFTRIAXONE SODIUM 500 MG IJ SOLR
INTRAMUSCULAR | Status: AC
Start: 2021-11-11 — End: ?
  Filled 2021-11-11: qty 500

## 2021-11-11 MED ORDER — CEFTRIAXONE SODIUM 500 MG IJ SOLR
500.0000 mg | Freq: Once | INTRAMUSCULAR | Status: AC
  Administered 2021-11-11: 500 mg via INTRAMUSCULAR

## 2021-11-11 MED ORDER — LIDOCAINE HCL (PF) 1 % IJ SOLN
INTRAMUSCULAR | Status: AC
  Filled 2021-11-11: qty 2

## 2021-11-11 NOTE — ED Triage Notes (Addendum)
4 day h/o sore throat and tingling pressure sensation with urination. ?Denies penile discharge. No uri sxs, urinary urgency nor frequency. Pt thinks that his throat pain may be STD related. Pt reports that he and his partner have only had oral sex and that sxs started after kissing. Pt is also requesting a full STD panel including blood work. ?

## 2021-11-11 NOTE — ED Provider Notes (Signed)
[facility]    CSN: [id number] Arrival date & time: 11/11/21  1900      History   Chief Complaint Chief Complaint  Patient presents with   throat pain    HPI Marc Sandoval is a 31 y.o. male presenting with sore throat and tingling with urination for 4 days following unprotected oral intercourse with new partner.  History gonorrhea.  States it feels like he has strep.  States that he only had oral intercourse and kissed his partner.  Requesting full STI panel.  Denies trouble swallowing, shortness of breath, cough, fever/chills.  HPI  Past Medical History:  Diagnosis Date   Anxiety    Depression    Hypertension    Migraines    PTSD (post-traumatic stress disorder)     Patient Active Problem List   Diagnosis Date Noted   Gonorrhea 03/31/2021   On pre-exposure prophylaxis for HIV 03/31/2021   Generalized anxiety disorder 10/15/2020   PTSD (post-traumatic stress disorder) 10/15/2020   Major depressive disorder, recurrent episode, moderate (HCC) 09/06/2020   History of substance abuse (HCC) 09/06/2020   Panic attack as reaction to stress 09/06/2020   Vitamin D deficiency 02/06/2020   Closed fracture of left ankle 10/13/2018    Past Surgical History:  Procedure Laterality Date   OTHER SURGICAL HISTORY     pt reports head surgery in past   skin graft on back         Home Medications    Prior to Admission medications   Medication Sig Start Date End Date Taking? Authorizing Provider  benzonatate (TESSALON) 100 MG capsule Take 1 capsule (100 mg total) by mouth every 8 (eight) hours. 04/24/21   Valinda Hoar, NP  emtricitabine-tenofovir AF (DESCOVY) 200-25 MG tablet Take 1 tablet by mouth daily. 10/27/21     fluticasone (FLONASE) 50 MCG/ACT nasal spray Place 1-2 sprays into both nostrils daily. 01/03/21   Wieters, Hallie C, PA-C  guaiFENesin (MUCINEX) 600 MG 12 hr tablet Take 1 tablet (600 mg total) by mouth 2 (two) times daily. 04/24/21   Valinda Hoar,  NP  hydrOXYzine (ATARAX) 25 MG tablet Take 1 tablet (25 mg total) by mouth every 6 (six) hours. 09/29/21   Piontek, Denny Peon, MD  methocarbamol (ROBAXIN) 500 MG tablet Take 1 tablet (500 mg total) by mouth 2 (two) times daily. 02/26/21   Rushie Chestnut, PA-C  ondansetron (ZOFRAN ODT) 4 MG disintegrating tablet Take 1 tablet (4 mg total) by mouth every 8 (eight) hours as needed for nausea or vomiting. 04/15/21   Waldon Merl, PA-C  sertraline (ZOLOFT) 25 MG tablet Take 1 tablet (25 mg total) by mouth daily. 04/27/21   Cora Collum, DO  dicyclomine (BENTYL) 20 MG tablet Take 1 tablet (20 mg total) by mouth 2 (two) times daily. 06/20/19 01/31/20  Cathie Hoops, Amy V, PA-C  PARoxetine (PAXIL) 20 MG tablet Take 1 tablet (20 mg total) by mouth daily. 08/04/20 08/31/20  Hoy Register, MD    Family History Family History  Problem Relation Age of Onset   Cancer Father    Healthy Daughter    Heart disease Maternal Grandmother    Cancer Paternal Grandmother    Aneurysm Maternal Aunt    Heart attack Maternal Aunt    Cirrhosis Paternal Grandfather    Cirrhosis Maternal Uncle    Cancer Maternal Uncle    HIV/AIDS Maternal Uncle    Stroke Neg Hx     Social History Social History   Tobacco  Use   Smoking status: Never   Smokeless tobacco: Never  Vaping Use   Vaping Use: Never used  Substance Use Topics   Alcohol use: Yes    Comment: occ   Drug use: Yes    Types: Marijuana    Comment: occasionally smokes marijuana     Allergies   Dayquil [pseudoephedrine-apap-dm], Doxylamine-phenylephrine-apap, Doxylamine-phenylephrine-apap, Nyquil hbp cold & flu [dm-doxylamine-acetaminophen], and Vancomycin   Review of Systems Review of Systems  Constitutional:  Negative for appetite change, chills and fever.  HENT:  Positive for sore throat. Negative for congestion, ear pain, rhinorrhea, sinus pressure and sinus pain.   Eyes:  Negative for redness and visual disturbance.  Respiratory:  Negative for  cough, chest tightness, shortness of breath and wheezing.   Cardiovascular:  Negative for chest pain and palpitations.  Gastrointestinal:  Negative for abdominal pain, constipation, diarrhea, nausea and vomiting.  Genitourinary:  Negative for dysuria, frequency and urgency.  Musculoskeletal:  Negative for myalgias.  Neurological:  Negative for dizziness, weakness and headaches.  Psychiatric/Behavioral:  Negative for confusion.   All other systems reviewed and are negative.   Physical Exam Triage Vital Signs ED Triage Vitals [11/11/21 1941]  Enc Vitals Group     BP      Pulse      Resp      Temp      Temp src      SpO2      Weight      Height      Head Circumference      Peak Flow      Pain Score 6     Pain Loc      Pain Edu?      Excl. in [place]?    No data found.  Updated Vital Signs There were no vitals taken for this visit.  Visual Acuity Right Eye Distance:   Left Eye Distance:   Bilateral Distance:    Right Eye Near:   Left Eye Near:    Bilateral Near:     Physical Exam Vitals reviewed.  Constitutional:      General: He is not in acute distress.    Appearance: Normal appearance. He is not ill-appearing or diaphoretic.  HENT:     Head: Normocephalic and atraumatic.     Mouth/Throat:     Pharynx: Posterior oropharyngeal erythema present.     Tonsils: 1+ on the right. 1+ on the left.     Comments: Tonsils 1+ bilaterally without exudate. Smooth erythema posterior pharynx. On exam, uvula is midline, he is tolerating secretions without difficulty, there is no trismus, no drooling, he has normal phonation  Cardiovascular:     Rate and Rhythm: Normal rate and regular rhythm.     Heart sounds: Normal heart sounds.  Pulmonary:     Effort: Pulmonary effort is normal.     Breath sounds: Normal breath sounds.  Abdominal:     Tenderness: There is no abdominal tenderness.     Comments: No pain   Lymphadenopathy:     Cervical: No cervical adenopathy.     Right  cervical: No superficial cervical adenopathy.    Left cervical: No superficial cervical adenopathy.  Skin:    General: Skin is warm.  Neurological:     General: No focal deficit present.     Mental Status: He is alert and oriented to person, place, and time.  Psychiatric:        Mood and Affect: Mood normal.  Behavior: Behavior normal.        Thought Content: Thought content normal.        Judgment: Judgment normal.     UC Treatments / Results  Labs (all labs ordered are listed, but only abnormal results are displayed) Labs Reviewed  CULTURE, GROUP A STREP ([facility])  RPR  HIV ANTIBODY (ROUTINE TESTING W REFLEX)  POCT RAPID STREP A, ED / UC  CYTOLOGY, (ORAL, ANAL, URETHRAL) ANCILLARY ONLY  CYTOLOGY, (ORAL, ANAL, URETHRAL) ANCILLARY ONLY    EKG   Radiology No results found.  Procedures Procedures (including critical care time)  Medications Ordered in UC Medications  cefTRIAXone (ROCEPHIN) injection 500 mg (has no administration in time range)    Initial Impression / Assessment and Plan / UC Course  I have reviewed the triage vital signs and the nursing notes.  Pertinent labs & imaging results that were available during my care of the patient were reviewed by me and considered in my medical decision making (see chart for details).     This patient is a very pleasant 31 y.o. year old male presenting with STI exposure. Afebrile, nontachy, no abd pain or flank pain.  Rapid strep negative, culture sent.  Unprotected oral intercourse with new partner. Will send self-swab for G/C, trich. I also collected a pharyngeal swab for G/C. Also sent HIV, RPR. Safe sex precautions.    Suspect pharyngeal gonorrhea; IM rocephin administered during visit.   ED return precautions discussed. Patient verbalizes understanding and agreement.     Final Clinical Impressions(s) / UC Diagnoses   Final diagnoses:  Acute pharyngitis, unspecified etiology  Routine screening for STI  (sexually transmitted infection)  Screening for streptococcal infection     Discharge Instructions      -We'll call you with any positive lab results. This typically takes 2-3 days.  -Abstain from intercourse until negative lab results or treatment is complete.      ED Prescriptions   None    PDMP not reviewed this encounter.   Rhys Martini, PA-C 11/11/21 2008

## 2021-11-11 NOTE — Discharge Instructions (Addendum)
-  We'll call you with any positive lab results. This typically takes 2-3 days.  ?-Abstain from intercourse until negative lab results or treatment is complete.  ?

## 2021-11-12 LAB — RPR: RPR Ser Ql: NONREACTIVE

## 2021-11-14 LAB — CULTURE, GROUP A STREP (THRC)

## 2021-11-14 LAB — CYTOLOGY, (ORAL, ANAL, URETHRAL) ANCILLARY ONLY
Chlamydia: NEGATIVE
Comment: NEGATIVE
Comment: NEGATIVE
Comment: NORMAL
Neisseria Gonorrhea: NEGATIVE
Trichomonas: NEGATIVE

## 2021-11-15 LAB — CYTOLOGY, (ORAL, ANAL, URETHRAL) ANCILLARY ONLY
Chlamydia: NEGATIVE
Comment: NEGATIVE
Comment: NEGATIVE
Comment: NORMAL
Neisseria Gonorrhea: NEGATIVE
Trichomonas: NEGATIVE

## 2021-11-29 ENCOUNTER — Other Ambulatory Visit (HOSPITAL_COMMUNITY): Payer: Self-pay

## 2022-01-20 ENCOUNTER — Telehealth: Payer: Self-pay | Admitting: Family Medicine

## 2022-01-20 ENCOUNTER — Telehealth: Payer: Medicaid Other

## 2022-01-20 DIAGNOSIS — K529 Noninfective gastroenteritis and colitis, unspecified: Secondary | ICD-10-CM

## 2022-01-20 MED ORDER — ONDANSETRON HCL 4 MG PO TABS: 4.0000 mg | ORAL_TABLET | Freq: Three times a day (TID) | ORAL | 0 refills | Status: DC | PRN

## 2022-01-20 NOTE — Progress Notes (Signed)
Virtual Visit Consent   Marc Sandoval, you are scheduled for a virtual visit with a [facility] provider today. Just as with appointments in the office, your consent must be obtained to participate. Your consent will be active for this visit and any virtual visit you may have with one of our providers in the next 365 days. If you have a MyChart account, a copy of this consent can be sent to you electronically.  As this is a virtual visit, video technology does not allow for your provider to perform a traditional examination. This may limit your provider's ability to fully assess your condition. If your provider identifies any concerns that need to be evaluated in person or the need to arrange testing (such as labs, EKG, etc.), we will make arrangements to do so. Although advances in technology are sophisticated, we cannot ensure that it will always work on either your end or our end. If the connection with a video visit is poor, the visit may have to be switched to a telephone visit. With either a video or telephone visit, we are not always able to ensure that we have a secure connection.  By engaging in this virtual visit, you consent to the provision of healthcare and authorize for your insurance to be billed (if applicable) for the services provided during this visit. Depending on your insurance coverage, you may receive a charge related to this service.  I need to obtain your verbal consent now. Are you willing to proceed with your visit today? Marc Sandoval has provided verbal consent on 01/20/2022 for a virtual visit (video or telephone). Georgana Curio, FNP  Date: 01/20/2022 4:45 PM  Virtual Visit via Video Note   I, Georgana Curio, connected with  Marc Sandoval  ([id number], [birth year]) on 01/20/22 at  4:30 PM EDT by a video-enabled telemedicine application and verified that I am speaking with the correct person using two identifiers.  Location: Patient: Virtual Visit Location Patient:  Home Provider: Virtual Visit Location Provider: Home Office   I discussed the limitations of evaluation and management by telemedicine and the availability of in person appointments. The patient expressed understanding and agreed to proceed.    History of Present Illness: Marc Sandoval is a 31 y.o. who identifies as a male who was assigned male at birth, and is being seen today for diarrhea with nausea starting last Saturday. No vomiting since Wednesday. No fever. Ate chilis Friday and says it started the next day. He says he continues to be nauseated and having diarrhea. He says he does not have a car. [name] Kitchen  HPI: HPI  Problems:  Patient Active Problem List   Diagnosis Date Noted   Gonorrhea 03/31/2021   On pre-exposure prophylaxis for HIV 03/31/2021   Generalized anxiety disorder 10/15/2020   PTSD (post-traumatic stress disorder) 10/15/2020   Major depressive disorder, recurrent episode, moderate (HCC) 09/06/2020   History of substance abuse (HCC) 09/06/2020   Panic attack as reaction to stress 09/06/2020   Vitamin D deficiency 02/06/2020   Closed fracture of left ankle 10/13/2018    Allergies:  Allergies  Allergen Reactions   Dayquil [Pseudoephedrine-Apap-Dm] Hives    Reports cause hives, sweats, cold symptoms   Doxylamine-Phenylephrine-Apap Hives   Doxylamine-Phenylephrine-Apap Hives   Nyquil Hbp Cold & Flu [Dm-Doxylamine-Acetaminophen] Hives    Reports cause hives, sweats, cold symptoms   Vancomycin    Medications:  Current Outpatient Medications:    ondansetron (ZOFRAN) 4 MG tablet, Take 1 tablet (4 mg total) by mouth  every 8 (eight) hours as needed for nausea or vomiting., Disp: 20 tablet, Rfl: 0   benzonatate (TESSALON) 100 MG capsule, Take 1 capsule (100 mg total) by mouth every 8 (eight) hours., Disp: 21 capsule, Rfl: 0   emtricitabine-tenofovir AF (DESCOVY) 200-25 MG tablet, Take 1 tablet by mouth daily., Disp: 30 tablet, Rfl: 2   fluticasone (FLONASE) 50 MCG/ACT nasal  spray, Place 1-2 sprays into both nostrils daily., Disp: 16 g, Rfl: 0   guaiFENesin (MUCINEX) 600 MG 12 hr tablet, Take 1 tablet (600 mg total) by mouth 2 (two) times daily., Disp: 20 tablet, Rfl: 0   hydrOXYzine (ATARAX) 25 MG tablet, Take 1 tablet (25 mg total) by mouth every 6 (six) hours., Disp: 12 tablet, Rfl: 0   methocarbamol (ROBAXIN) 500 MG tablet, Take 1 tablet (500 mg total) by mouth 2 (two) times daily., Disp: 20 tablet, Rfl: 0   ondansetron (ZOFRAN ODT) 4 MG disintegrating tablet, Take 1 tablet (4 mg total) by mouth every 8 (eight) hours as needed for nausea or vomiting., Disp: 20 tablet, Rfl: 0   sertraline (ZOLOFT) 25 MG tablet, Take 1 tablet (25 mg total) by mouth daily., Disp: 30 tablet, Rfl: 0  Observations/Objective: Patient is well-developed, well-nourished in no acute distress.  Resting comfortably  at home.  Head is normocephalic, atraumatic.  No labored breathing.  Speech is clear and coherent with logical content.  Patient is alert and oriented at baseline.    Assessment and Plan: 1. Gastroenteritis  Push fluids, no milk or dairy, med use and side effects discussed, ER or urgent care if sx persist or worsen.   Follow Up Instructions: I discussed the assessment and treatment plan with the patient. The patient was provided an opportunity to ask questions and all were answered. The patient agreed with the plan and demonstrated an understanding of the instructions.  A copy of instructions were sent to the patient via MyChart unless otherwise noted below.     The patient was advised to call back or seek an in-person evaluation if the symptoms worsen or if the condition fails to improve as anticipated.  Time:  I spent 10 minutes with the patient via telehealth technology discussing the above problems/concerns.    Georgana Curio, FNP

## 2022-01-20 NOTE — Patient Instructions (Signed)
Food Choices to Help Relieve Diarrhea, Adult Diarrhea can make you feel weak and cause you to become dehydrated. It is important to choose the right foods and drinks to: Relieve diarrhea. Replace lost fluids and nutrients. Prevent dehydration. What are tips for following this plan? Relieving diarrhea Avoid foods that make your diarrhea worse. These may include: Foods and beverages sweetened with high-fructose corn syrup, honey, or sweeteners such as xylitol, sorbitol, and mannitol. Fried, greasy, or spicy foods. Raw fruits and vegetables. Eat foods that are rich in probiotics. These include foods such as yogurt and fermented milk products. Probiotics can help increase healthy bacteria in your stomach and intestines (gastrointestinal tract or GI tract). This may help digestion and stop diarrhea. If you have lactose intolerance, avoid dairy products. These may make your diarrhea worse. Take medicine to help stop diarrhea only as told by your health care provider. Replacing nutrients  Eat bland, easy-to-digest foods in small amounts as you are able, until your diarrhea starts to get better. These foods include bananas, applesauce, rice, toast, and crackers. Gradually reintroduce nutrient-rich foods as tolerated or as told by your health care provider. This includes: Well-cooked protein foods, such as eggs, lean meats like fish or chicken without skin, and tofu. Peeled, seeded, and soft-cooked fruits and vegetables. Low-fat dairy products. Whole grains. Take vitamin and mineral supplements as told by your health care provider. Preventing dehydration  Start by sipping water or a solution to prevent dehydration (oral rehydration solution, ORS). This is a drink that helps replace fluids and minerals your body has lost. You can buy an ORS at pharmacies and retail stores. Try to drink at least 8-10 cups (2,000-2,500 mL) of fluid each day to help replace lost fluids. If you have urine that is pale  yellow, you are getting enough fluids. You may drink other liquids in addition to water, such as fruit juice that you have added water to (diluted fruit juice) or low-calorie sports drinks, as tolerated or as told by your health care provider. Avoid drinks with caffeine, such as coffee, tea, or soft drinks. Avoid alcohol. Summary When you have diarrhea, it is important to choose the right foods and drinks to relieve diarrhea, to replace lost fluids and nutrients, and to prevent dehydration. Make sure you drink enough fluid to keep your urine pale yellow. You may benefit from eating bland foods at first. Gradually reintroduce healthy, nutrient-rich foods as tolerated or as told by your health care provider. Avoid foods that make your diarrhea worse, such as fried, greasy, or spicy foods. This information is not intended to replace advice given to you by your health care provider. Make sure you discuss any questions you have with your health care provider. Document Revised: 10/07/2019 Document Reviewed: 10/07/2019 Elsevier Patient Education  2023 Elsevier Inc.  

## 2022-01-27 ENCOUNTER — Other Ambulatory Visit (HOSPITAL_COMMUNITY): Payer: Self-pay

## 2022-01-28 ENCOUNTER — Other Ambulatory Visit (HOSPITAL_COMMUNITY): Payer: Self-pay

## 2022-02-15 ENCOUNTER — Other Ambulatory Visit (HOSPITAL_COMMUNITY): Payer: Self-pay

## 2022-02-21 ENCOUNTER — Other Ambulatory Visit (HOSPITAL_COMMUNITY): Payer: Self-pay

## 2022-02-23 ENCOUNTER — Other Ambulatory Visit (HOSPITAL_COMMUNITY): Payer: Self-pay

## 2022-03-01 ENCOUNTER — Other Ambulatory Visit (HOSPITAL_COMMUNITY): Payer: Self-pay

## 2022-03-01 ENCOUNTER — Telehealth: Payer: Medicaid Other | Admitting: Physician Assistant

## 2022-03-01 DIAGNOSIS — A049 Bacterial intestinal infection, unspecified: Secondary | ICD-10-CM

## 2022-03-01 MED ORDER — CIPROFLOXACIN HCL 500 MG PO TABS: 500.0000 mg | ORAL_TABLET | Freq: Two times a day (BID) | ORAL | 0 refills | Status: DC

## 2022-03-01 MED ORDER — ONDANSETRON 4 MG PO TBDP: 4.0000 mg | ORAL_TABLET | Freq: Three times a day (TID) | ORAL | 0 refills | Status: DC | PRN

## 2022-03-01 NOTE — Progress Notes (Signed)
Virtual Visit Consent   Marc Sandoval, you are scheduled for a virtual visit with a [facility] provider today. Just as with appointments in the office, your consent must be obtained to participate. Your consent will be active for this visit and any virtual visit you may have with one of our providers in the next 365 days. If you have a MyChart account, a copy of this consent can be sent to you electronically.  As this is a virtual visit, video technology does not allow for your provider to perform a traditional examination. This may limit your provider's ability to fully assess your condition. If your provider identifies any concerns that need to be evaluated in person or the need to arrange testing (such as labs, EKG, etc.), we will make arrangements to do so. Although advances in technology are sophisticated, we cannot ensure that it will always work on either your end or our end. If the connection with a video visit is poor, the visit may have to be switched to a telephone visit. With either a video or telephone visit, we are not always able to ensure that we have a secure connection.  By engaging in this virtual visit, you consent to the provision of healthcare and authorize for your insurance to be billed (if applicable) for the services provided during this visit. Depending on your insurance coverage, you may receive a charge related to this service.  I need to obtain your verbal consent now. Are you willing to proceed with your visit today? Marc Sandoval has provided verbal consent on 03/01/2022 for a virtual visit (video or telephone). Marc Sandoval, [place]  Date: 03/01/2022 1:58 PM  Virtual Visit via Video Note   I, Marc Sandoval, connected with  Marc Sandoval  ([id number], [birth year]) on 03/01/22 at  1:45 PM EDT by a video-enabled telemedicine application and verified that I am speaking with the correct person using two identifiers.  Location: Patient: Virtual Visit Location  Patient: Home Provider: Virtual Visit Location Provider: Home Office   I discussed the limitations of evaluation and management by telemedicine and the availability of in person appointments. The patient expressed understanding and agreed to proceed.    History of Present Illness: Marc Sandoval is a 31 y.o. who identifies as a male who was assigned male at birth, and is being seen today for significant diarrhea starting over a week ago with nausea and vomiting. Started after beginning doing home juicing and smoothies. Notes he uses frozen ingredients and recently got a recall regarding a couple of the items he had purchased and been using with possible bacterial contamination. Notes initially with low-grade fever that has been intermittent. Some headache. Denies melena, hematochezia or tenesmus. Has been able to keep hydrated. Last episode of emesis was yesterday. His daughter was eating the same foods as him and now has similar symptoms.    HPI: HPI  Problems:  Patient Active Problem List   Diagnosis Date Noted   Gonorrhea 03/31/2021   On pre-exposure prophylaxis for HIV 03/31/2021   Generalized anxiety disorder 10/15/2020   PTSD (post-traumatic stress disorder) 10/15/2020   Major depressive disorder, recurrent episode, moderate (HCC) 09/06/2020   History of substance abuse (HCC) 09/06/2020   Panic attack as reaction to stress 09/06/2020   Vitamin D deficiency 02/06/2020   Closed fracture of left ankle 10/13/2018    Allergies:  Allergies  Allergen Reactions   Dayquil [Pseudoephedrine-Apap-Dm] Hives    Reports cause hives, sweats, cold symptoms   Doxylamine-Phenylephrine-Apap  Hives   Doxylamine-Phenylephrine-Apap Hives   Nyquil Hbp Cold & Flu [Dm-Doxylamine-Acetaminophen] Hives    Reports cause hives, sweats, cold symptoms   Vancomycin    Medications:  Current Outpatient Medications:    ciprofloxacin (CIPRO) 500 MG tablet, Take 1 tablet (500 mg total) by mouth 2 (two) times  daily., Disp: 14 tablet, Rfl: 0   ondansetron (ZOFRAN-ODT) 4 MG disintegrating tablet, Take 1 tablet (4 mg total) by mouth every 8 (eight) hours as needed for nausea or vomiting., Disp: 20 tablet, Rfl: 0   benzonatate (TESSALON) 100 MG capsule, Take 1 capsule (100 mg total) by mouth every 8 (eight) hours., Disp: 21 capsule, Rfl: 0   emtricitabine-tenofovir AF (DESCOVY) 200-25 MG tablet, Take 1 tablet by mouth daily., Disp: 30 tablet, Rfl: 2   fluticasone (FLONASE) 50 MCG/ACT nasal spray, Place 1-2 sprays into both nostrils daily., Disp: 16 g, Rfl: 0   guaiFENesin (MUCINEX) 600 MG 12 hr tablet, Take 1 tablet (600 mg total) by mouth 2 (two) times daily., Disp: 20 tablet, Rfl: 0   hydrOXYzine (ATARAX) 25 MG tablet, Take 1 tablet (25 mg total) by mouth every 6 (six) hours., Disp: 12 tablet, Rfl: 0   methocarbamol (ROBAXIN) 500 MG tablet, Take 1 tablet (500 mg total) by mouth 2 (two) times daily., Disp: 20 tablet, Rfl: 0   sertraline (ZOLOFT) 25 MG tablet, Take 1 tablet (25 mg total) by mouth daily., Disp: 30 tablet, Rfl: 0  Observations/Objective: Patient is well-developed, well-nourished in no acute distress.  Resting comfortably at home.  Head is normocephalic, atraumatic.  No labored breathing. Speech is clear and coherent with logical content.  Patient is alert and oriented at baseline.   Assessment and Plan: 1. Bacterial gastroenteritis - ondansetron (ZOFRAN-ODT) 4 MG disintegrating tablet; Take 1 tablet (4 mg total) by mouth every 8 (eight) hours as needed for nausea or vomiting.  Dispense: 20 tablet; Refill: 0 - ciprofloxacin (CIPRO) 500 MG tablet; Take 1 tablet (500 mg total) by mouth 2 (two) times daily.  Dispense: 14 tablet; Refill: 0  Concern for bacterial contamination of his frozen fruit after getting notification of recall. Afebrile now thankfully but still with significant symptoms. Will start BRAT diet and zofran along with daily probiotic. Continue PO fluids. Will start Cipro as  prescribed. Strict in-person follow-up precautions reviewed.   Follow Up Instructions: I discussed the assessment and treatment plan with the patient. The patient was provided an opportunity to ask questions and all were answered. The patient agreed with the plan and demonstrated an understanding of the instructions.  A copy of instructions were sent to the patient via MyChart unless otherwise noted below.   Patient has requested to receive PHI (AVS, Work Notes, etc) pertaining to this video visit through e-mail as they are currently without active MyChart. They have voiced understand that email is not considered secure and their health information could be viewed by someone other than the patient.   The patient was advised to call back or seek an in-person evaluation if the symptoms worsen or if the condition fails to improve as anticipated.  Time:  I spent 10 minutes with the patient via telehealth technology discussing the above problems/concerns.    Marc Climes, PA-C  [employer]

## 2022-03-01 NOTE — Patient Instructions (Addendum)
Hughie Closs, thank you for joining [name], PA-C for today's virtual visit.  While this provider is not your primary care provider (PCP), if your PCP is located in our provider database this encounter information will be shared with them immediately following your visit.  Consent: (Patient) Marc Sandoval provided verbal consent for this virtual visit at the beginning of the encounter.  Current Medications:  Current Outpatient Medications:    benzonatate (TESSALON) 100 MG capsule, Take 1 capsule (100 mg total) by mouth every 8 (eight) hours., Disp: 21 capsule, Rfl: 0   emtricitabine-tenofovir AF (DESCOVY) 200-25 MG tablet, Take 1 tablet by mouth daily., Disp: 30 tablet, Rfl: 2   fluticasone (FLONASE) 50 MCG/ACT nasal spray, Place 1-2 sprays into both nostrils daily., Disp: 16 g, Rfl: 0   guaiFENesin (MUCINEX) 600 MG 12 hr tablet, Take 1 tablet (600 mg total) by mouth 2 (two) times daily., Disp: 20 tablet, Rfl: 0   hydrOXYzine (ATARAX) 25 MG tablet, Take 1 tablet (25 mg total) by mouth every 6 (six) hours., Disp: 12 tablet, Rfl: 0   methocarbamol (ROBAXIN) 500 MG tablet, Take 1 tablet (500 mg total) by mouth 2 (two) times daily., Disp: 20 tablet, Rfl: 0   ondansetron (ZOFRAN ODT) 4 MG disintegrating tablet, Take 1 tablet (4 mg total) by mouth every 8 (eight) hours as needed for nausea or vomiting., Disp: 20 tablet, Rfl: 0   ondansetron (ZOFRAN) 4 MG tablet, Take 1 tablet (4 mg total) by mouth every 8 (eight) hours as needed for nausea or vomiting., Disp: 20 tablet, Rfl: 0   sertraline (ZOLOFT) 25 MG tablet, Take 1 tablet (25 mg total) by mouth daily., Disp: 30 tablet, Rfl: 0   Medications ordered in this encounter:  No orders of the defined types were placed in this encounter.    *If you need refills on other medications prior to your next appointment, please contact your pharmacy*  Follow-Up: Call back or seek an in-person evaluation if the symptoms worsen or if the condition  fails to improve as anticipated.  Other Instructions Keep hydrated. Start a daily probiotic OTC. Follow the dietary recommendations listed below.  Use the Zofran as directed, if needed, for nausea or vomiting.  Take the antibiotic as directed. If symptoms are not resolving, or any new/worsening symptoms, you need to seek an in-person evaluation ASAP.  Food Choices to Help Relieve Diarrhea, Adult Diarrhea can make you feel weak and cause you to become dehydrated. It is important to choose the right foods and drinks to: Relieve diarrhea. Replace lost fluids and nutrients. Prevent dehydration. What are tips for following this plan? Relieving diarrhea Avoid foods that make your diarrhea worse. These may include: Foods and beverages sweetened with high-fructose corn syrup, honey, or sweeteners such as xylitol, sorbitol, and mannitol. Fried, greasy, or spicy foods. Raw fruits and vegetables. Eat foods that are rich in probiotics. These include foods such as yogurt and fermented milk products. Probiotics can help increase healthy bacteria in your stomach and intestines (gastrointestinal tract or GI tract). This may help digestion and stop diarrhea. If you have lactose intolerance, avoid dairy products. These may make your diarrhea worse. Take medicine to help stop diarrhea only as told by your health care provider. Replacing nutrients  Eat bland, easy-to-digest foods in small amounts as you are able, until your diarrhea starts to get better. These foods include bananas, applesauce, rice, toast, and crackers. Gradually reintroduce nutrient-rich foods as tolerated or as told by your health care  provider. This includes: Well-cooked protein foods, such as eggs, lean meats like fish or chicken without skin, and tofu. Peeled, seeded, and soft-cooked fruits and vegetables. Low-fat dairy products. Whole grains. Take vitamin and mineral supplements as told by your health care provider. Preventing  dehydration  Start by sipping water or a solution to prevent dehydration (oral rehydration solution, ORS). This is a drink that helps replace fluids and minerals your body has lost. You can buy an ORS at pharmacies and retail stores. Try to drink at least 8-10 cups (2,000-2,500 mL) of fluid each day to help replace lost fluids. If you have urine that is pale yellow, you are getting enough fluids. You may drink other liquids in addition to water, such as fruit juice that you have added water to (diluted fruit juice) or low-calorie sports drinks, as tolerated or as told by your health care provider. Avoid drinks with caffeine, such as coffee, tea, or soft drinks. Avoid alcohol. Summary When you have diarrhea, it is important to choose the right foods and drinks to relieve diarrhea, to replace lost fluids and nutrients, and to prevent dehydration. Make sure you drink enough fluid to keep your urine pale yellow. You may benefit from eating bland foods at first. Gradually reintroduce healthy, nutrient-rich foods as tolerated or as told by your health care provider. Avoid foods that make your diarrhea worse, such as fried, greasy, or spicy foods. This information is not intended to replace advice given to you by your health care provider. Make sure you discuss any questions you have with your health care provider. Document Revised: 10/07/2019 Document Reviewed: 10/07/2019 Elsevier Patient Education  2023 Elsevier Inc.    If you have been instructed to have an in-person evaluation today at a local Urgent Care facility, please use the link below. It will take you to a list of all of our available [facility], including address, phone number and hours of operation. Please do not delay care.  [facility]  If you or a family member do not have a primary care provider, use the link below to schedule a visit and establish care. When you choose a [facility] primary care physician  or advanced practice provider, you gain a long-term partner in health. Find a Primary Care Provider  Learn more about [facility]'s in-office and virtual care options: [facility] - Get Care Now

## 2022-03-09 ENCOUNTER — Other Ambulatory Visit (HOSPITAL_COMMUNITY): Payer: Self-pay

## 2022-03-14 ENCOUNTER — Other Ambulatory Visit (HOSPITAL_COMMUNITY): Payer: Self-pay

## 2022-03-15 ENCOUNTER — Other Ambulatory Visit (HOSPITAL_COMMUNITY): Payer: Self-pay

## 2022-03-21 ENCOUNTER — Other Ambulatory Visit (HOSPITAL_COMMUNITY): Payer: Self-pay

## 2022-03-22 ENCOUNTER — Telehealth: Payer: Medicaid Other | Admitting: Nurse Practitioner

## 2022-03-30 ENCOUNTER — Other Ambulatory Visit (HOSPITAL_COMMUNITY): Payer: Self-pay

## 2022-03-31 ENCOUNTER — Other Ambulatory Visit (HOSPITAL_COMMUNITY): Payer: Self-pay

## 2022-04-03 ENCOUNTER — Other Ambulatory Visit (HOSPITAL_COMMUNITY): Payer: Self-pay

## 2022-04-11 ENCOUNTER — Other Ambulatory Visit (HOSPITAL_COMMUNITY): Payer: Self-pay

## 2022-04-14 ENCOUNTER — Other Ambulatory Visit (HOSPITAL_COMMUNITY): Payer: Self-pay

## 2022-04-17 ENCOUNTER — Other Ambulatory Visit (HOSPITAL_COMMUNITY): Payer: Self-pay

## 2022-04-26 ENCOUNTER — Other Ambulatory Visit (HOSPITAL_COMMUNITY): Payer: Self-pay

## 2022-04-28 ENCOUNTER — Other Ambulatory Visit (HOSPITAL_COMMUNITY): Payer: Self-pay

## 2022-05-15 ENCOUNTER — Other Ambulatory Visit (HOSPITAL_COMMUNITY): Payer: Self-pay

## 2022-05-19 ENCOUNTER — Other Ambulatory Visit (HOSPITAL_COMMUNITY): Payer: Self-pay

## 2022-05-25 ENCOUNTER — Other Ambulatory Visit (HOSPITAL_COMMUNITY): Payer: Self-pay

## 2022-05-25 MED ORDER — DESCOVY 200-25 MG PO TABS
1.0000 | ORAL_TABLET | Freq: Every day | ORAL | 2 refills | Status: DC
  Filled 2022-06-19 – 2022-11-22 (×2): qty 30, 30d supply, fill #0
  Filled 2022-12-23: qty 30, 30d supply, fill #1
  Filled 2023-01-30: qty 30, 30d supply, fill #2

## 2022-05-25 MED ORDER — DOXYCYCLINE MONOHYDRATE 100 MG PO TABS
200.0000 mg | ORAL_TABLET | ORAL | 6 refills | Status: DC
  Filled 2022-05-25 – 2022-06-19 (×2): qty 30, 15d supply, fill #0

## 2022-06-19 ENCOUNTER — Encounter (HOSPITAL_COMMUNITY): Payer: Self-pay

## 2022-06-19 ENCOUNTER — Ambulatory Visit (HOSPITAL_COMMUNITY)
Admission: EM | Admit: 2022-06-19 | Discharge: 2022-06-19 | Disposition: A | Discharge status: Home / Self Care | Payer: Self-pay | Attending: Nurse Practitioner | Admitting: Nurse Practitioner

## 2022-06-19 ENCOUNTER — Other Ambulatory Visit (HOSPITAL_COMMUNITY): Payer: Self-pay

## 2022-06-19 DIAGNOSIS — Z202 Contact with and (suspected) exposure to infections with a predominantly sexual mode of transmission: Secondary | ICD-10-CM | POA: Insufficient documentation

## 2022-06-19 NOTE — ED Provider Notes (Signed)
[facility]    CSN: [id number] Arrival date & time: 06/19/22  1126      History   Chief Complaint Chief Complaint  Patient presents with   Sore Throat   Headache    HPI Marc Sandoval is a 31 y.o. male.   HPI  He is in today complaining of headache and sore throat. He is concern that he may have been exposed to syphilis. [facility] and [facility]. October 2 he was donated blood and was contacted that he needed to seek medical attention because he test was inconclusive.  Denies fever or chills, oral lesions,, sore throat, pelvic pain, penile discharge, dysuria, nocturia any visible ulcers or lesions. He is on prophylactic treatment for HIV.   Past Medical History:  Diagnosis Date   Anxiety    Depression    Hypertension    Migraines    PTSD (post-traumatic stress disorder)     Patient Active Problem List   Diagnosis Date Noted   Gonorrhea 03/31/2021   On pre-exposure prophylaxis for HIV 03/31/2021   Generalized anxiety disorder 10/15/2020   PTSD (post-traumatic stress disorder) 10/15/2020   Major depressive disorder, recurrent episode, moderate (HCC) 09/06/2020   History of substance abuse (HCC) 09/06/2020   Panic attack as reaction to stress 09/06/2020   Vitamin D deficiency 02/06/2020   Closed fracture of left ankle 10/13/2018    Past Surgical History:  Procedure Laterality Date   OTHER SURGICAL HISTORY     pt reports head surgery in past   skin graft on back         Home Medications    Prior to Admission medications   Medication Sig Start Date End Date Taking? Authorizing Provider  benzonatate (TESSALON) 100 MG capsule Take 1 capsule (100 mg total) by mouth every 8 (eight) hours. 04/24/21   White, Elita Boone, NP  ciprofloxacin (CIPRO) 500 MG tablet Take 1 tablet (500 mg total) by mouth 2 (two) times daily. 03/01/22   Waldon Merl, PA-C  doxycycline (ADOXA) 100 MG tablet Take 2 tablets by  mouth as a single dose as directed, 24-72 hours after intercourse. 05/25/22     emtricitabine-tenofovir AF (DESCOVY) 200-25 MG tablet Take 1 tablet by mouth daily. 05/25/22     fluticasone (FLONASE) 50 MCG/ACT nasal spray Place 1-2 sprays into both nostrils daily. 01/03/21   Wieters, Hallie C, PA-C  guaiFENesin (MUCINEX) 600 MG 12 hr tablet Take 1 tablet (600 mg total) by mouth 2 (two) times daily. 04/24/21   Valinda Hoar, NP  hydrOXYzine (ATARAX) 25 MG tablet Take 1 tablet (25 mg total) by mouth every 6 (six) hours. 09/29/21   Piontek, Denny Peon, MD  methocarbamol (ROBAXIN) 500 MG tablet Take 1 tablet (500 mg total) by mouth 2 (two) times daily. 02/26/21   Rushie Chestnut, PA-C  ondansetron (ZOFRAN-ODT) 4 MG disintegrating tablet Take 1 tablet (4 mg total) by mouth every 8 (eight) hours as needed for nausea or vomiting. 03/01/22   Waldon Merl, PA-C  sertraline (ZOLOFT) 25 MG tablet Take 1 tablet (25 mg total) by mouth daily. 04/27/21   Cora Collum, DO  dicyclomine (BENTYL) 20 MG tablet Take 1 tablet (20 mg total) by mouth 2 (two) times daily. 06/20/19 01/31/20  Cathie Hoops, Amy V, PA-C  PARoxetine (PAXIL) 20 MG tablet Take 1 tablet (20 mg total) by mouth daily. 08/04/20 08/31/20  Hoy Register, MD    Family History Family History  Problem Relation Age of Onset   Cancer Father    Healthy Daughter    Heart disease Maternal Grandmother    Cancer Paternal Grandmother    Aneurysm Maternal Aunt    Heart attack Maternal Aunt    Cirrhosis Paternal Grandfather    Cirrhosis Maternal Uncle    Cancer Maternal Uncle    HIV/AIDS Maternal Uncle    Stroke Neg Hx     Social History Social History   Tobacco Use   Smoking status: Never   Smokeless tobacco: Never  Vaping Use   Vaping Use: Never used  Substance Use Topics   Alcohol use: Yes    Comment: occ   Drug use: Yes    Types: Marijuana    Comment: occasionally smokes marijuana     Allergies   Dayquil [pseudoephedrine-apap-dm],  Doxylamine-phenylephrine-apap, Doxylamine-phenylephrine-apap, Nyquil hbp cold & flu [dm-doxylamine-acetaminophen], and Vancomycin   Review of Systems Review of Systems   Physical Exam Triage Vital Signs ED Triage Vitals  Enc Vitals Group     BP 06/19/22 1249 132/83     Pulse Rate 06/19/22 1249 94     Resp 06/19/22 1249 12     Temp 06/19/22 1249 98.8 F (37.1 C)     Temp Source 06/19/22 1249 Oral     SpO2 06/19/22 1249 97 %     Weight --      Height --      Head Circumference --      Peak Flow --      Pain Score 06/19/22 1247 8     Pain Loc --      Pain Edu? --      Excl. in [place]? --    No data found.  Updated Vital Signs BP 132/83 (BP Location: Left Arm)   Pulse 94   Temp 98.8 F (37.1 C) (Oral)   Resp 12   SpO2 97%   Visual Acuity Right Eye Distance:   Left Eye Distance:   Bilateral Distance:    Right Eye Near:   Left Eye Near:    Bilateral Near:     Physical Exam Constitutional:      General: He is not in acute distress.    Appearance: He is normal weight. He is not ill-appearing, toxic-appearing or diaphoretic.  HENT:     Head: Normocephalic and atraumatic.     Mouth/Throat:     Tonsils: 3+ on the right. 3+ on the left.  Cardiovascular:     Rate and Rhythm: Normal rate.  Pulmonary:     Effort: Pulmonary effort is normal.  Skin:    General: Skin is warm.     Capillary Refill: Capillary refill takes less than 2 seconds.  Neurological:     General: No focal deficit present.     Mental Status: He is alert and oriented to person, place, and time.      UC Treatments / Results  Labs (all labs ordered are listed, but only abnormal results are displayed) Labs Reviewed  [facility]  CYTOLOGY, (ORAL, ANAL, URETHRAL) ANCILLARY ONLY    EKG   Radiology No results found.  Procedures Procedures (including critical care time)  Medications Ordered in UC Medications - No data to display  Initial Impression / Assessment and Plan / UC Course  I have  reviewed the triage vital signs and the nursing notes.  Pertinent labs & imaging results that were available during my care of the patient were reviewed by me and considered in my medical  decision making (see chart for details).    Sore throat Final Clinical Impressions(s) / UC Diagnoses   Final diagnoses:  Exposure to sexually transmitted disease (STD)     Discharge Instructions      Your STI testing is pending You will be called with any positive results for additional treatment options.       ED Prescriptions   None    PDMP not reviewed this encounter.   Thad Ranger [place], [place] 06/19/22 2032

## 2022-06-19 NOTE — ED Triage Notes (Signed)
Pt is here for headache and sore throat . Pt is concerned about a STD x3-5 days

## 2022-06-19 NOTE — Discharge Instructions (Addendum)
Your STI testing is pending You will be called with any positive results for additional treatment options.

## 2022-06-20 ENCOUNTER — Other Ambulatory Visit (HOSPITAL_COMMUNITY): Payer: Self-pay

## 2022-06-20 LAB — RPR: RPR Ser Ql: NONREACTIVE

## 2022-06-21 LAB — CYTOLOGY, (ORAL, ANAL, URETHRAL) ANCILLARY ONLY
Chlamydia: NEGATIVE
Comment: NEGATIVE
Comment: NEGATIVE
Comment: NORMAL
Neisseria Gonorrhea: NEGATIVE
Trichomonas: NEGATIVE

## 2022-09-01 ENCOUNTER — Ambulatory Visit
Admission: EM | Admit: 2022-09-01 | Discharge: 2022-09-01 | Disposition: A | Discharge status: Home / Self Care | Payer: Medicaid Other | Attending: Internal Medicine | Admitting: Internal Medicine

## 2022-09-01 ENCOUNTER — Other Ambulatory Visit (HOSPITAL_COMMUNITY): Payer: Self-pay

## 2022-09-01 DIAGNOSIS — Z206 Contact with and (suspected) exposure to human immunodeficiency virus [HIV]: Secondary | ICD-10-CM | POA: Diagnosis not present

## 2022-09-01 DIAGNOSIS — Z113 Encounter for screening for infections with a predominantly sexual mode of transmission: Secondary | ICD-10-CM | POA: Diagnosis not present

## 2022-09-01 DIAGNOSIS — H6123 Impacted cerumen, bilateral: Secondary | ICD-10-CM

## 2022-09-01 DIAGNOSIS — H9203 Otalgia, bilateral: Secondary | ICD-10-CM

## 2022-09-01 DIAGNOSIS — Z202 Contact with and (suspected) exposure to infections with a predominantly sexual mode of transmission: Secondary | ICD-10-CM | POA: Diagnosis not present

## 2022-09-01 DIAGNOSIS — Z114 Encounter for screening for human immunodeficiency virus [HIV]: Secondary | ICD-10-CM | POA: Diagnosis not present

## 2022-09-01 MED ORDER — DESCOVY 200-25 MG PO TABS
1.0000 | ORAL_TABLET | Freq: Every day | ORAL | 2 refills | Status: DC
  Filled 2022-09-01 – 2022-09-02 (×2): qty 30, 30d supply, fill #0

## 2022-09-01 NOTE — ED Triage Notes (Signed)
Pt presents to [facility] with bilateral otalgia for one week pt reports l side is worse. Pt reports new headphones and thay made a loud screetching noise and he was on a flight so he is not sure what caused it, pt has taken advil for pain

## 2022-09-01 NOTE — ED Provider Notes (Signed)
[name]    CSN: [id number] Arrival date & time: 09/01/22  1837      History   Chief Complaint Chief Complaint  Patient presents with   Otalgia    HPI Marc Sandoval is a 31 y.o. male.   Patient presents with bilateral otalgia that has been present for about 1 to 1.5 weeks.  Patient denies any associated upper respiratory symptoms, cough, fever.  Denies trauma, foreign body, decreased hearing, drainage from the ears.  Patient reports that he started using new earphones and is not sure if this is related.  Has taken Advil for symptoms of minimal improvement.   Otalgia   Past Medical History:  Diagnosis Date   Anxiety    Depression    Hypertension    Migraines    PTSD (post-traumatic stress disorder)     Patient Active Problem List   Diagnosis Date Noted   Gonorrhea 03/31/2021   On pre-exposure prophylaxis for HIV 03/31/2021   Generalized anxiety disorder 10/15/2020   PTSD (post-traumatic stress disorder) 10/15/2020   Major depressive disorder, recurrent episode, moderate (HCC) 09/06/2020   History of substance abuse (HCC) 09/06/2020   Panic attack as reaction to stress 09/06/2020   Vitamin D deficiency 02/06/2020   Closed fracture of left ankle 10/13/2018    Past Surgical History:  Procedure Laterality Date   OTHER SURGICAL HISTORY     pt reports head surgery in past   skin graft on back         Home Medications    Prior to Admission medications   Medication Sig Start Date End Date Taking? Authorizing Provider  benzonatate (TESSALON) 100 MG capsule Take 1 capsule (100 mg total) by mouth every 8 (eight) hours. 04/24/21   White, Elita Boone, NP  ciprofloxacin (CIPRO) 500 MG tablet Take 1 tablet (500 mg total) by mouth 2 (two) times daily. 03/01/22   Waldon Merl, PA-C  doxycycline (ADOXA) 100 MG tablet Take 2 tablets by mouth as a single dose as directed, 24-72 hours after intercourse. 05/25/22     emtricitabine-tenofovir AF (DESCOVY)  200-25 MG tablet Take 1 tablet by mouth daily. 05/25/22     emtricitabine-tenofovir AF (DESCOVY) 200-25 MG tablet Take 1 tablet by mouth daily. 09/01/22     fluticasone (FLONASE) 50 MCG/ACT nasal spray Place 1-2 sprays into both nostrils daily. 01/03/21   Wieters, Hallie C, PA-C  guaiFENesin (MUCINEX) 600 MG 12 hr tablet Take 1 tablet (600 mg total) by mouth 2 (two) times daily. 04/24/21   Valinda Hoar, NP  hydrOXYzine (ATARAX) 25 MG tablet Take 1 tablet (25 mg total) by mouth every 6 (six) hours. 09/29/21   Piontek, Denny Peon, MD  methocarbamol (ROBAXIN) 500 MG tablet Take 1 tablet (500 mg total) by mouth 2 (two) times daily. 02/26/21   Rushie Chestnut, PA-C  ondansetron (ZOFRAN-ODT) 4 MG disintegrating tablet Take 1 tablet (4 mg total) by mouth every 8 (eight) hours as needed for nausea or vomiting. 03/01/22   Waldon Merl, PA-C  sertraline (ZOLOFT) 25 MG tablet Take 1 tablet (25 mg total) by mouth daily. 04/27/21   Cora Collum, DO  dicyclomine (BENTYL) 20 MG tablet Take 1 tablet (20 mg total) by mouth 2 (two) times daily. 06/20/19 01/31/20  Cathie Hoops, Amy V, PA-C  PARoxetine (PAXIL) 20 MG tablet Take 1 tablet (20 mg total) by mouth daily. 08/04/20 08/31/20  Hoy Register, MD    Family History Family History  Problem Relation Age of Onset  Cancer Father    Healthy Daughter    Heart disease Maternal Grandmother    Cancer Paternal Grandmother    Aneurysm Maternal Aunt    Heart attack Maternal Aunt    Cirrhosis Paternal Grandfather    Cirrhosis Maternal Uncle    Cancer Maternal Uncle    HIV/AIDS Maternal Uncle    Stroke Neg Hx     Social History Social History   Tobacco Use   Smoking status: Never   Smokeless tobacco: Never  Vaping Use   Vaping Use: Never used  Substance Use Topics   Alcohol use: Yes    Comment: occ   Drug use: Yes    Types: Marijuana    Comment: occasionally smokes marijuana     Allergies   Dayquil [pseudoephedrine-apap-dm],  Doxylamine-phenylephrine-apap, Doxylamine-phenylephrine-apap, Nyquil hbp cold & flu [dm-doxylamine-acetaminophen], and Vancomycin   Review of Systems Review of Systems Per HPI  Physical Exam Triage Vital Signs ED Triage Vitals  Enc Vitals Group     BP 09/01/22 1939 124/82     Pulse Rate 09/01/22 1939 63     Resp 09/01/22 1939 18     Temp 09/01/22 1939 97.9 F (36.6 C)     Temp src --      SpO2 09/01/22 1939 98 %     Weight --      Height --      Head Circumference --      Peak Flow --      Pain Score 09/01/22 1938 7     Pain Loc --      Pain Edu? --      Excl. in [place]? --    No data found.  Updated Vital Signs BP 124/82   Pulse 63   Temp 97.9 F (36.6 C)   Resp 18   SpO2 98%   Visual Acuity Right Eye Distance:   Left Eye Distance:   Bilateral Distance:    Right Eye Near:   Left Eye Near:    Bilateral Near:     Physical Exam Constitutional:      General: He is not in acute distress.    Appearance: Normal appearance. He is not toxic-appearing or diaphoretic.  HENT:     Head: Normocephalic and atraumatic.     Right Ear: External ear normal. There is impacted cerumen.     Left Ear: External ear normal. There is impacted cerumen.     Ears:     Comments: Impacted cerumen bilaterally.  Unable to visualize TM. Eyes:     Extraocular Movements: Extraocular movements intact.     Conjunctiva/sclera: Conjunctivae normal.  Pulmonary:     Effort: Pulmonary effort is normal.  Neurological:     General: No focal deficit present.     Mental Status: He is alert and oriented to person, place, and time. Mental status is at baseline.  Psychiatric:        Mood and Affect: Mood normal.        Behavior: Behavior normal.        Thought Content: Thought content normal.        Judgment: Judgment normal.      UC Treatments / Results  Labs (all labs ordered are listed, but only abnormal results are displayed) Labs Reviewed - No data to display  EKG   Radiology No  results found.  Procedures Procedures (including critical care time)  Medications Ordered in UC Medications - No data to display  Initial Impression / Assessment and  Plan / UC Course  I have reviewed the triage vital signs and the nursing notes.  Pertinent labs & imaging results that were available during my care of the patient were reviewed by me and considered in my medical decision making (see chart for details).     Patient has significant impacted cerumen bilaterally on original physical exam.  Clinical staff attempted to irrigate ears bilaterally without successful removal of cerumen.  Unable to visualize TM.  I do think patient needs to see ENT specialist given pain and bilateral impacted cerumen.  Patient was given contact information for ENT and advised that I cannot do an official referral.  Advised to contact them as soon as possible to schedule appointment for further evaluation.  Advised Debrox over-the-counter to help alleviate discomfort.  Discussed supportive care.  Discussed return precautions.  Patient verbalized understanding and was agreeable with plan. Final Clinical Impressions(s) / UC Diagnoses   Final diagnoses:  Bilateral impacted cerumen  Acute otalgia, bilateral     Discharge Instructions      You may try Debrox over-the-counter to help alleviate your impacted earwax.  I also recommend that you follow-up with your specialist at provided contact information on Monday to schedule appointment for further evaluation.    ED Prescriptions   None    PDMP not reviewed this encounter.   Teodora Medici, [place] 09/01/22 2026

## 2022-09-01 NOTE — Discharge Instructions (Signed)
You may try Debrox over-the-counter to help alleviate your impacted earwax.  I also recommend that you follow-up with your specialist at provided contact information on Monday to schedule appointment for further evaluation.

## 2022-09-02 ENCOUNTER — Other Ambulatory Visit (HOSPITAL_COMMUNITY): Payer: Self-pay

## 2022-09-05 ENCOUNTER — Other Ambulatory Visit (HOSPITAL_COMMUNITY): Payer: Self-pay

## 2022-09-06 ENCOUNTER — Other Ambulatory Visit (HOSPITAL_COMMUNITY): Payer: Self-pay

## 2022-09-06 DIAGNOSIS — A545 Gonococcal pharyngitis: Secondary | ICD-10-CM | POA: Diagnosis not present

## 2022-09-25 ENCOUNTER — Ambulatory Visit (HOSPITAL_COMMUNITY)
Admission: EM | Admit: 2022-09-25 | Discharge: 2022-09-25 | Disposition: A | Discharge status: Home / Self Care | Payer: Medicaid Other | Attending: Family Medicine | Admitting: Family Medicine

## 2022-09-25 ENCOUNTER — Other Ambulatory Visit: Payer: Self-pay

## 2022-09-25 ENCOUNTER — Encounter (HOSPITAL_COMMUNITY): Payer: Self-pay | Admitting: *Deleted

## 2022-09-25 DIAGNOSIS — Z113 Encounter for screening for infections with a predominantly sexual mode of transmission: Secondary | ICD-10-CM | POA: Insufficient documentation

## 2022-09-25 DIAGNOSIS — J029 Acute pharyngitis, unspecified: Secondary | ICD-10-CM | POA: Insufficient documentation

## 2022-09-25 LAB — HIV ANTIBODY (ROUTINE TESTING W REFLEX): HIV Screen 4th Generation wRfx: NONREACTIVE

## 2022-09-25 LAB — POCT RAPID STREP A, ED / UC: Streptococcus, Group A Screen (Direct): NEGATIVE

## 2022-09-25 NOTE — ED Triage Notes (Signed)
Pt reports a sore throat and possible STD

## 2022-09-25 NOTE — ED Provider Notes (Addendum)
[facility]    CSN: [id number] Arrival date & time: 09/25/22  1338      History   Chief Complaint Chief Complaint  Patient presents with   Sore Throat   Exposure to STD    HPI Marc Sandoval is a 32 y.o. male.    Sore Throat  Exposure to STD   Here for sore throat.  His throat has been sore for about 2 to 3 days.  He is also had some ear pain.  That actually has begun before that.  No fever or vomiting.  He expresses concern that this might be related to an STD.  He does not have any current penile discharge or itching or dysuria.  No abdominal pain.  He does also request lab work to assess HIV and RPR  Past Medical History:  Diagnosis Date   Anxiety    Depression    Hypertension    Migraines    PTSD (post-traumatic stress disorder)     Patient Active Problem List   Diagnosis Date Noted   Gonorrhea 03/31/2021   On pre-exposure prophylaxis for HIV 03/31/2021   Generalized anxiety disorder 10/15/2020   PTSD (post-traumatic stress disorder) 10/15/2020   Major depressive disorder, recurrent episode, moderate ([place]) 09/06/2020   History of substance abuse ([place]) 09/06/2020   Panic attack as reaction to stress 09/06/2020   Vitamin D deficiency 02/06/2020   Closed fracture of left ankle 10/13/2018    Past Surgical History:  Procedure Laterality Date   OTHER SURGICAL HISTORY     pt reports head surgery in past   skin graft on back         Home Medications    Prior to Admission medications   Medication Sig Start Date End Date Taking? Authorizing Provider  emtricitabine-tenofovir AF (DESCOVY) 200-25 MG tablet Take 1 tablet by mouth daily. 05/25/22     emtricitabine-tenofovir AF (DESCOVY) 200-25 MG tablet Take 1 tablet by mouth daily. 09/01/22     fluticasone (FLONASE) 50 MCG/ACT nasal spray Place 1-2 sprays into both nostrils daily. 01/03/21   Wieters, Hallie C, PA-C  hydrOXYzine (ATARAX) 25 MG tablet Take 1 tablet (25 mg total) by mouth every 6 (six)  hours. 09/29/21   Piontek, Junie Panning, MD  ondansetron (ZOFRAN-ODT) 4 MG disintegrating tablet Take 1 tablet (4 mg total) by mouth every 8 (eight) hours as needed for nausea or vomiting. 03/01/22   Brunetta Jeans, PA-C  sertraline (ZOLOFT) 25 MG tablet Take 1 tablet (25 mg total) by mouth daily. 04/27/21   Shary Key, DO  dicyclomine (BENTYL) 20 MG tablet Take 1 tablet (20 mg total) by mouth 2 (two) times daily. 06/20/19 01/31/20  Tasia Catchings, Amy V, PA-C  PARoxetine (PAXIL) 20 MG tablet Take 1 tablet (20 mg total) by mouth daily. 08/04/20 08/31/20  Charlott Rakes, MD    Family History Family History  Problem Relation Age of Onset   Cancer Father    Healthy Daughter    Heart disease Maternal Grandmother    Cancer Paternal Grandmother    Aneurysm Maternal Aunt    Heart attack Maternal Aunt    Cirrhosis Paternal Grandfather    Cirrhosis Maternal Uncle    Cancer Maternal Uncle    HIV/AIDS Maternal Uncle    Stroke Neg Hx     Social History Social History   Tobacco Use   Smoking status: Never   Smokeless tobacco: Never  Vaping Use   Vaping Use: Never used  Substance Use Topics  Alcohol use: Yes    Comment: occ   Drug use: Yes    Types: Marijuana    Comment: occasionally smokes marijuana     Allergies   Dayquil [pseudoephedrine-apap-dm], Doxylamine-phenylephrine-apap, Doxylamine-phenylephrine-apap, Nyquil hbp cold & flu [dm-doxylamine-acetaminophen], and Vancomycin   Review of Systems Review of Systems   Physical Exam Triage Vital Signs ED Triage Vitals  Enc Vitals Group     BP 09/25/22 1609 (!) 143/95     Pulse Rate 09/25/22 1609 70     Resp 09/25/22 1609 18     Temp 09/25/22 1609 98.6 F (37 C)     Temp src --      SpO2 09/25/22 1609 100 %     Weight --      Height --      Head Circumference --      Peak Flow --      Pain Score 09/25/22 1606 4     Pain Loc --      Pain Edu? --      Excl. in [place]? --    No data found.  Updated Vital Signs BP (!) 143/95   Pulse  70   Temp 98.6 F (37 C)   Resp 18   SpO2 100%   Visual Acuity Right Eye Distance:   Left Eye Distance:   Bilateral Distance:    Right Eye Near:   Left Eye Near:    Bilateral Near:     Physical Exam Vitals reviewed.  Constitutional:      General: He is not in acute distress.    Appearance: He is not toxic-appearing.  HENT:     Right Ear: Tympanic membrane and ear canal normal.     Left Ear: Tympanic membrane and ear canal normal.     Nose: Nose normal.     Mouth/Throat:     Mouth: Mucous membranes are moist.     Comments: Tonsils are 2+ hypertrophy with clear and yellow mucus in the tonsillar crypts.  There is no erythema. Eyes:     Extraocular Movements: Extraocular movements intact.     Conjunctiva/sclera: Conjunctivae normal.     Pupils: Pupils are equal, round, and reactive to light.  Cardiovascular:     Rate and Rhythm: Normal rate and regular rhythm.     Heart sounds: No murmur heard. Pulmonary:     Effort: No respiratory distress.     Breath sounds: No stridor. No wheezing, rhonchi or rales.  Musculoskeletal:     Cervical back: Neck supple.  Lymphadenopathy:     Cervical: No cervical adenopathy.  Skin:    Capillary Refill: Capillary refill takes less than 2 seconds.     Coloration: Skin is not jaundiced or pale.  Neurological:     General: No focal deficit present.     Mental Status: He is alert and oriented to person, place, and time.  Psychiatric:        Behavior: Behavior normal.      UC Treatments / Results  Labs (all labs ordered are listed, but only abnormal results are displayed) Labs Reviewed  RPR  HIV ANTIBODY (ROUTINE TESTING W REFLEX)  POCT RAPID STREP A, ED / UC  CYTOLOGY, (ORAL, ANAL, URETHRAL) ANCILLARY ONLY  CYTOLOGY, (ORAL, ANAL, URETHRAL) ANCILLARY ONLY    EKG   Radiology No results found.  Procedures Procedures (including critical care time)  Medications Ordered in UC Medications - No data to display  Initial  Impression / Assessment and Plan / UC  Course  I have reviewed the triage vital signs and the nursing notes.  Pertinent labs & imaging results that were available during my care of the patient were reviewed by me and considered in my medical decision making (see chart for details).        Rapid strep is negative.  With his lack of fever amoxicillin throat culture.  I have done a swab for STDs of his oropharynx.  And he is also done a self swab of the urethra.  Staff will notify him of any positives and treat per protocol.  Also HIV and RPR are drawn at his request Final Clinical Impressions(s) / UC Diagnoses   Final diagnoses:  Pharyngitis, unspecified etiology  Screen for STD (sexually transmitted disease)     Discharge Instructions      Strep test is negative  Staff will notify you if there is anything positive on either swab or on the blood work.  [place] as needed for pain     ED Prescriptions   None    PDMP not reviewed this encounter.   Barrett Henle, MD 09/25/22 1712    Barrett Henle, MD 09/25/22 1714    Barrett Henle, MD 09/26/22 1019

## 2022-09-25 NOTE — Discharge Instructions (Signed)
Strep test is negative  Staff will notify you if there is anything positive on either swab or on the blood work.  Lanelle as needed for pain

## 2022-09-26 LAB — CYTOLOGY, (ORAL, ANAL, URETHRAL) ANCILLARY ONLY
Chlamydia: NEGATIVE
Chlamydia: NEGATIVE
Comment: NEGATIVE
Comment: NEGATIVE
Comment: NEGATIVE
Comment: NEGATIVE
Comment: NORMAL
Comment: NORMAL
Neisseria Gonorrhea: NEGATIVE
Neisseria Gonorrhea: NEGATIVE
Trichomonas: NEGATIVE
Trichomonas: NEGATIVE

## 2022-09-26 LAB — RPR: RPR Ser Ql: NONREACTIVE

## 2022-10-12 ENCOUNTER — Telehealth: Payer: Self-pay

## 2022-10-12 NOTE — Telephone Encounter (Signed)
Mychart msg sent. AS, CMA 

## 2022-11-15 ENCOUNTER — Telehealth (INDEPENDENT_AMBULATORY_CARE_PROVIDER_SITE_OTHER): Payer: Self-pay | Admitting: Primary Care

## 2022-11-15 NOTE — Telephone Encounter (Signed)
Spoke with patient about his appointment in the morning. Patient confirmed appointment.

## 2022-11-16 ENCOUNTER — Encounter (INDEPENDENT_AMBULATORY_CARE_PROVIDER_SITE_OTHER): Payer: Self-pay | Admitting: Primary Care

## 2022-11-16 ENCOUNTER — Ambulatory Visit (INDEPENDENT_AMBULATORY_CARE_PROVIDER_SITE_OTHER): Payer: Medicaid Other | Admitting: Primary Care

## 2022-11-16 ENCOUNTER — Ambulatory Visit (INDEPENDENT_AMBULATORY_CARE_PROVIDER_SITE_OTHER): Payer: Medicaid Other | Admitting: Licensed Clinical Social Worker

## 2022-11-16 VITALS — BP 138/82 | HR 55 | Resp 16 | Ht 77.0 in | Wt 169.2 lb

## 2022-11-16 DIAGNOSIS — Z9289 Personal history of other medical treatment: Secondary | ICD-10-CM

## 2022-11-16 DIAGNOSIS — Z7689 Persons encountering health services in other specified circumstances: Secondary | ICD-10-CM | POA: Diagnosis not present

## 2022-11-16 DIAGNOSIS — Z131 Encounter for screening for diabetes mellitus: Secondary | ICD-10-CM

## 2022-11-16 DIAGNOSIS — F331 Major depressive disorder, recurrent, moderate: Secondary | ICD-10-CM

## 2022-11-16 DIAGNOSIS — E559 Vitamin D deficiency, unspecified: Secondary | ICD-10-CM

## 2022-11-16 DIAGNOSIS — F4323 Adjustment disorder with mixed anxiety and depressed mood: Secondary | ICD-10-CM | POA: Diagnosis not present

## 2022-11-16 DIAGNOSIS — Z13220 Encounter for screening for lipoid disorders: Secondary | ICD-10-CM | POA: Diagnosis not present

## 2022-11-16 MED ORDER — BUSPIRONE HCL 5 MG PO TABS: 5.0000 mg | ORAL_TABLET | Freq: Two times a day (BID) | ORAL | 1 refills | Status: DC
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
## 2022-11-16 NOTE — Patient Instructions (Signed)
Buspirone Tablets What is this medication? BUSPIRONE (byoo SPYE rone) treats anxiety. It works by balancing the levels of dopamine and serotonin in your brain, substances that help regulate mood. This medicine may be used for other purposes; ask your health care provider or pharmacist if you have questions. COMMON BRAND NAME(S): BuSpar, Buspar Dividose What should I tell my care team before I take this medication? They need to know if you have any of these conditions: Kidney or liver disease An unusual or allergic reaction to buspirone, other medications, foods, dyes, or preservatives Pregnant or trying to get pregnant Breast-feeding How should I use this medication? Take this medication by mouth with a glass of water. Follow the directions on the prescription label. You may take this medication with or without food. To ensure that this medication always works the same way for you, you should take it either always with or always without food. Take your doses at regular intervals. Do not take your medication more often than directed. Do not stop taking except on the advice of your care team. Talk to your care team about the use of this medication in children. Special care may be needed. Overdosage: If you think you have taken too much of this medicine contact a poison control center or emergency room at once. NOTE: This medicine is only for you. Do not share this medicine with others. What if I miss a dose? If you miss a dose, take it as soon as you can. If it is almost time for your next dose, take only that dose. Do not take double or extra doses. What may interact with this medication? Do not take this medication with any of the following: Linezolid MAOIs like Carbex, Eldepryl, Marplan, Nardil, and Parnate Methylene blue Procarbazine This medication may also interact with the following: Diazepam Digoxin Diltiazem Erythromycin Grapefruit juice Haloperidol Medications for mental  depression or mood problems Medications for seizures like carbamazepine, phenobarbital and phenytoin Nefazodone Other medications for anxiety Rifampin Ritonavir Some antifungal medications like itraconazole, ketoconazole, and voriconazole Verapamil Warfarin This list may not describe all possible interactions. Give your health care provider a list of all the medicines, herbs, non-prescription drugs, or dietary supplements you use. Also tell them if you smoke, drink alcohol, or use illegal drugs. Some items may interact with your medicine. What should I watch for while using this medication? Visit your care team for regular checks on your progress. It may take 1 to 2 weeks before your anxiety gets better. This medication may affect your coordination, reaction time, or judgment. Do not drive or operate machinery until you know how this medication affects you. Sit up or stand slowly to reduce the risk of dizzy or fainting spells. Drinking alcohol with this medication can increase the risk of these side effects. What side effects may I notice from receiving this medication? Side effects that you should report to your care team as soon as possible: Allergic reactions--skin rash, itching, hives, swelling of the face, lips, tongue, or throat Irritability, confusion, fast or irregular heartbeat, muscle stiffness, twitching muscles, sweating, high fever, seizure, chills, vomiting, diarrhea, which may be signs of serotonin syndrome Side effects that usually do not require medical attention (report to your care team if they continue or are bothersome): Anxiety, nervousness Dizziness Drowsiness Headache Nausea Trouble sleeping This list may not describe all possible side effects. Call your doctor for medical advice about side effects. You may report side effects to FDA at 1-800-FDA-1088. Where should I keep   my medication? Keep out of the reach of children. Store at room temperature below 30 degrees C  (86 degrees F). Protect from light. Keep container tightly closed. Throw away any unused medication after the expiration date. NOTE: This sheet is a summary. It may not cover all possible information. If you have questions about this medicine, talk to your doctor, pharmacist, or health care provider.  2023 Elsevier/Gold Standard (2022-03-13 00:00:00)  

## 2022-11-16 NOTE — BH Specialist Note (Signed)
Integrated Behavioral Health Initial In-Person Visit  MRN: [id number] Name: Martravious Fryson  Number of [facility] Clinician visits: 1- Initial Visit  Session Start time: [phone]    Session End time: 0937  Total time in minutes: 41   Types of Service: Individual psychotherapy  Interpretor:No. Interpretor Name and Language: n/a   Warm Hand Off Completed.    Subjective: Micholas Krumholz is a 32 y.o. male accompanied by  himself. Patient was referred by PCP for high phq9/gad7 (depression). Patient reports the following symptoms/concerns: often thoughts of no longer wanting to be here. Duration of problem: for years but more involving in the last year; Severity of problem: mild  Objective: Mood: Anxious and Affect: Appropriate Risk of harm to self or others: No plan to harm self or others  Life Context: Family and Social: Patient currently has a roommate.  Patient has several sisters and is the only boy. School/Work: Currently looking for employment. Self-Care: None reported Life Changes: Childhood trauma with patient and siblings and most recently niece.  Patient and/or Family's Strengths/Protective Factors: Concrete supports in place (healthy food, safe environments, etc.)  Goals Addressed: Patient will: Reduce symptoms of: anxiety and depression Increase knowledge and/or ability of: coping skills  Demonstrate ability to: Increase motivation to adhere to plan of care  Progress towards Goals: Ongoing  Interventions: Interventions utilized: Supportive Counseling  Standardized Assessments completed: GAD-7 and PHQ 9  Patient and/or Family Response: Patient discussed his family history and major concerns with his niece who is 62 years old.  Patient stated that recently several situation have been triggering for him and things he felt he had overcome. Pt is open to therapy and resources.  Patient Centered Plan: Patient is on the following Treatment Plan(s):   Depression  Assessment: Patient currently experiencing symptoms of anxiety and depression.  Patient scored high on PHQ-9 and GAD-7.   Patient may benefit from continued support from [facility].  Plan: Follow up with behavioral health clinician on : 1 week Behavioral recommendations: focusing on upcoming interview. Referral(s): [place] (In Clinic) "From scale of 1-10, how likely are you to follow plan?": 10  Shann Medal, LCSWA

## 2022-11-18 LAB — CBC WITH DIFFERENTIAL/PLATELET
Basophils Absolute: 0 10*3/uL (ref 0.0–0.2)
Basos: 0 %
EOS (ABSOLUTE): 0.2 10*3/uL (ref 0.0–0.4)
Eos: 3 %
Hematocrit: 45.1 % (ref 37.5–51.0)
Hemoglobin: 14.3 g/dL (ref 13.0–17.7)
Immature Grans (Abs): 0 10*3/uL (ref 0.0–0.1)
Immature Granulocytes: 0 %
Lymphocytes Absolute: 2.3 10*3/uL (ref 0.7–3.1)
Lymphs: 40 %
MCH: 28.9 pg (ref 26.6–33.0)
MCHC: 31.7 g/dL (ref 31.5–35.7)
MCV: 91 fL (ref 79–97)
Monocytes Absolute: 0.5 10*3/uL (ref 0.1–0.9)
Monocytes: 10 %
Neutrophils Absolute: 2.7 10*3/uL (ref 1.4–7.0)
Neutrophils: 47 %
Platelets: 242 10*3/uL (ref 150–450)
RBC: 4.95 x10E6/uL (ref 4.14–5.80)
RDW: 12.9 % (ref 11.6–15.4)
WBC: 5.7 10*3/uL (ref 3.4–10.8)

## 2022-11-18 LAB — LIPID PANEL
Chol/HDL Ratio: 3.2 ratio (ref 0.0–5.0)
Cholesterol, Total: 153 mg/dL (ref 100–199)
HDL: 48 mg/dL (ref >39)
LDL Chol Calc (NIH): 88 mg/dL (ref 0–99)
Triglycerides: 92 mg/dL (ref 0–149)
VLDL Cholesterol Cal: 17 mg/dL (ref 5–40)

## 2022-11-18 LAB — CMP14+EGFR
ALT: 13 IU/L (ref 0–44)
AST: 24 IU/L (ref 0–40)
Albumin/Globulin Ratio: 1.6 (ref 1.2–2.2)
Albumin: 5 g/dL (ref 4.1–5.1)
Alkaline Phosphatase: 73 IU/L (ref 44–121)
BUN/Creatinine Ratio: 11 (ref 9–20)
BUN: 12 mg/dL (ref 6–20)
Bilirubin Total: 0.3 mg/dL (ref 0.0–1.2)
CO2: 24 mmol/L (ref 20–29)
Calcium: 9.1 mg/dL (ref 8.7–10.2)
Chloride: 101 mmol/L (ref 96–106)
Creatinine, Ser: 1.06 mg/dL (ref 0.76–1.27)
Globulin, Total: 3.1 g/dL (ref 1.5–4.5)
Glucose: 83 mg/dL (ref 70–99)
Potassium: 4.3 mmol/L (ref 3.5–5.2)
Sodium: 142 mmol/L (ref 134–144)
Total Protein: 8.1 g/dL (ref 6.0–8.5)
eGFR: 96 mL/min/{1.73_m2} (ref >59)

## 2022-11-18 LAB — HEMOGLOBIN A1C
Est. average glucose Bld gHb Est-mCnc: 103 mg/dL
Hgb A1c MFr Bld: 5.2 % (ref 4.8–5.6)

## 2022-11-18 LAB — VITAMIN D 25 HYDROXY (VIT D DEFICIENCY, FRACTURES): Vit D, 25-Hydroxy: 8 ng/mL — ABNORMAL LOW (ref 30.0–100.0)

## 2022-11-19 ENCOUNTER — Other Ambulatory Visit (INDEPENDENT_AMBULATORY_CARE_PROVIDER_SITE_OTHER): Payer: Self-pay | Admitting: Primary Care

## 2022-11-19 MED ORDER — VITAMIN D (ERGOCALCIFEROL) 1.25 MG (50000 UNIT) PO CAPS: 50000.0000 [IU] | ORAL_CAPSULE | ORAL | 0 refills | Status: DC

## 2022-11-22 ENCOUNTER — Other Ambulatory Visit (HOSPITAL_COMMUNITY): Payer: Self-pay

## 2022-11-23 ENCOUNTER — Telehealth (INDEPENDENT_AMBULATORY_CARE_PROVIDER_SITE_OTHER): Payer: Self-pay | Admitting: Primary Care

## 2022-11-23 ENCOUNTER — Encounter (INDEPENDENT_AMBULATORY_CARE_PROVIDER_SITE_OTHER): Payer: Self-pay

## 2022-11-23 ENCOUNTER — Other Ambulatory Visit (INDEPENDENT_AMBULATORY_CARE_PROVIDER_SITE_OTHER): Payer: Self-pay

## 2022-11-23 ENCOUNTER — Encounter (INDEPENDENT_AMBULATORY_CARE_PROVIDER_SITE_OTHER): Payer: Medicaid Other | Admitting: Licensed Clinical Social Worker

## 2022-11-23 ENCOUNTER — Ambulatory Visit (INDEPENDENT_AMBULATORY_CARE_PROVIDER_SITE_OTHER): Payer: Medicaid Other | Admitting: Licensed Clinical Social Worker

## 2022-11-23 ENCOUNTER — Other Ambulatory Visit (HOSPITAL_COMMUNITY): Payer: Self-pay

## 2022-11-23 DIAGNOSIS — F331 Major depressive disorder, recurrent, moderate: Secondary | ICD-10-CM

## 2022-11-23 MED ORDER — BUSPIRONE HCL 5 MG PO TABS
5.0000 mg | ORAL_TABLET | Freq: Two times a day (BID) | ORAL | 1 refills | Status: AC
  Filled 2022-11-23: qty 68, 34d supply, fill #0
  Filled 2023-01-08: qty 68, 34d supply, fill #1

## 2022-11-23 MED ORDER — VITAMIN D (ERGOCALCIFEROL) 1.25 MG (50000 UNIT) PO CAPS
50000.0000 [IU] | ORAL_CAPSULE | ORAL | 0 refills | Status: AC
  Filled 2022-11-24: qty 12, 84d supply, fill #0

## 2022-11-23 NOTE — Telephone Encounter (Signed)
Pt says his prescriptions busPIRone (BUSPAR) 5 MG tablet [id number] Vitamin D, Ergocalciferol, (DRISDOL) 1.25 MG (50000 UNIT) CAPS capsule [id number] were sent to the wrong pharmacy. Pt says he needs them sent to the [facility].

## 2022-11-23 NOTE — Telephone Encounter (Signed)
Forwarded to nurse

## 2022-11-23 NOTE — Telephone Encounter (Signed)
Rx has been resent to pt preferred pharmacy

## 2022-11-24 ENCOUNTER — Other Ambulatory Visit: Payer: Self-pay

## 2022-11-24 ENCOUNTER — Other Ambulatory Visit (HOSPITAL_COMMUNITY): Payer: Self-pay

## 2022-11-27 ENCOUNTER — Other Ambulatory Visit (HOSPITAL_COMMUNITY): Payer: Self-pay

## 2022-11-27 NOTE — BH Specialist Note (Signed)
Integrated Behavioral Health via Telemedicine Visit  11/27/2022 Bilaal Desautel [id number]  Number of [facility] [facility] Clinician visits: 2- Second Visit  Session Start time: [id number]   Session End time: 1500  Total time in minutes: 71   Referring Provider: *** Patient/Family location: [facility] Provider location: *** All persons participating in visit: *** Types of Service: Individual psychotherapy and Video visit  I connected with Clyde Lundborg and/or Oswaldo Milian Hellmer's {family members:20773} via  Telephone or Video Enabled Telemedicine Application  (Video is Caregility application) and verified that I am speaking with the correct person using two identifiers. Discussed confidentiality: {YES/NO:21197}  I discussed the limitations of telemedicine and the availability of in person appointments.  Discussed there is a possibility of technology failure and discussed alternative modes of communication if that failure occurs.  I discussed that engaging in this telemedicine visit, they consent to the provision of behavioral healthcare and the services will be billed under their insurance.  Patient and/or legal guardian expressed understanding and consented to Telemedicine visit: {YES/NO:21197}  Presenting Concerns: Patient and/or family reports the following symptoms/concerns: *** Duration of problem: ***; Severity of problem: {Mild/Moderate/Severe:20260}  Patient and/or Family's Strengths/Protective Factors: {CHL AMB BH PROTECTIVE FACTORS:8724643303}  Goals Addressed: Patient will:  Reduce symptoms of: {IBH Symptoms:21014056}   Increase knowledge and/or ability of: {IBH Patient Tools:21014057}   Demonstrate ability to: {IBH Goals:21014053}  Progress towards Goals: {CHL AMB BH PROGRESS TOWARDS GOALS:5411975233}  Interventions: Interventions utilized:  {IBH Interventions:21014054} Standardized Assessments completed: {IBH Screening Tools:21014051}  Patient and/or Family  Response: ***  Assessment: Patient currently experiencing ***.   Patient may benefit from ***.  Plan: Follow up with behavioral health clinician on : *** Behavioral recommendations: *** Referral(s): {IBH Referrals:21014055}  I discussed the assessment and treatment plan with the patient and/or parent/guardian. They were provided an opportunity to ask questions and all were answered. They agreed with the plan and demonstrated an understanding of the instructions.   They were advised to call back or seek an in-person evaluation if the symptoms worsen or if the condition fails to improve as anticipated.  Shann Medal, LCSWA

## 2022-11-28 ENCOUNTER — Other Ambulatory Visit: Payer: Self-pay

## 2022-12-08 DIAGNOSIS — Z206 Contact with and (suspected) exposure to human immunodeficiency virus [HIV]: Secondary | ICD-10-CM | POA: Diagnosis not present

## 2022-12-08 DIAGNOSIS — Z202 Contact with and (suspected) exposure to infections with a predominantly sexual mode of transmission: Secondary | ICD-10-CM | POA: Diagnosis not present

## 2022-12-08 DIAGNOSIS — Z113 Encounter for screening for infections with a predominantly sexual mode of transmission: Secondary | ICD-10-CM | POA: Diagnosis not present

## 2022-12-08 DIAGNOSIS — Z114 Encounter for screening for human immunodeficiency virus [HIV]: Secondary | ICD-10-CM | POA: Diagnosis not present

## 2022-12-12 ENCOUNTER — Other Ambulatory Visit (HOSPITAL_COMMUNITY): Payer: Self-pay

## 2022-12-12 ENCOUNTER — Telehealth: Payer: Medicaid Other | Admitting: Physician Assistant

## 2022-12-12 DIAGNOSIS — J302 Other seasonal allergic rhinitis: Secondary | ICD-10-CM

## 2022-12-12 MED ORDER — OLOPATADINE HCL 0.1 % OP SOLN
1.0000 [drp] | Freq: Two times a day (BID) | OPHTHALMIC | 1 refills | Status: DC
  Filled 2022-12-12: qty 5, 50d supply, fill #0
  Filled 2022-12-23: qty 5, 20d supply, fill #0
  Filled 2023-01-08: qty 5, 50d supply, fill #0
  Filled 2023-11-30: qty 5, 34d supply, fill #0

## 2022-12-12 MED ORDER — FLUTICASONE PROPIONATE 50 MCG/ACT NA SUSP
2.0000 | Freq: Every day | NASAL | 0 refills | Status: DC
  Filled 2022-12-12 – 2022-12-14 (×2): qty 16, 30d supply, fill #0

## 2022-12-12 MED ORDER — LEVOCETIRIZINE DIHYDROCHLORIDE 5 MG PO TABS
5.0000 mg | ORAL_TABLET | Freq: Every evening | ORAL | 0 refills | Status: DC
  Filled 2022-12-12 – 2022-12-14 (×2): qty 30, 30d supply, fill #0
  Filled 2023-01-08 – 2023-11-30 (×2): qty 30, 30d supply, fill #1

## 2022-12-12 MED ORDER — AZELASTINE HCL 0.1 % NA SOLN
1.0000 | Freq: Two times a day (BID) | NASAL | 0 refills | Status: DC
  Filled 2022-12-12 – 2022-12-14 (×2): qty 30, 30d supply, fill #0

## 2022-12-12 NOTE — Patient Instructions (Signed)
Marc Sandoval, thank you for joining Marc Loveless, PA-C for today's virtual visit.  While this provider is not your primary care provider (PCP), if your PCP is located in our provider database this encounter information will be shared with them immediately following your visit.   A [facility] MyChart account gives you access to today's visit and all your visits, tests, and labs performed at [facility] " click here if you don't have a [facility] MyChart account or go to [email or web address]  Consent: (Patient) Marc Sandoval provided verbal consent for this virtual visit at the beginning of the encounter.  Current Medications:  Current Outpatient Medications:    azelastine (ASTELIN) 0.1 % nasal spray, Place 1 spray into both nostrils 2 (two) times daily. Use in each nostril as directed, Disp: 30 mL, Rfl: 0   fluticasone (FLONASE) 50 MCG/ACT nasal spray, Place 2 sprays into both nostrils daily., Disp: 16 g, Rfl: 0   levocetirizine (XYZAL) 5 MG tablet, Take 1 tablet (5 mg total) by mouth every evening., Disp: 90 tablet, Rfl: 0   olopatadine (PATANOL) 0.1 % ophthalmic solution, Place 1 drop into both eyes 2 (two) times daily., Disp: 5 mL, Rfl: 1   busPIRone (BUSPAR) 5 MG tablet, Take 1 tablet (5 mg total) by mouth 2 (two) times daily., Disp: 120 tablet, Rfl: 1   emtricitabine-tenofovir AF (DESCOVY) 200-25 MG tablet, Take 1 tablet by mouth daily., Disp: 30 tablet, Rfl: 2   emtricitabine-tenofovir AF (DESCOVY) 200-25 MG tablet, Take 1 tablet by mouth daily., Disp: 30 tablet, Rfl: 2   hydrOXYzine (ATARAX) 25 MG tablet, Take 1 tablet (25 mg total) by mouth every 6 (six) hours. (Patient not taking: Reported on 11/16/2022), Disp: 12 tablet, Rfl: 0   ondansetron (ZOFRAN-ODT) 4 MG disintegrating tablet, Take 1 tablet (4 mg total) by mouth every 8 (eight) hours as needed for nausea or vomiting. (Patient not taking: Reported on 11/16/2022), Disp: 20 tablet, Rfl: 0   sertraline (ZOLOFT)  25 MG tablet, Take 1 tablet (25 mg total) by mouth daily. (Patient not taking: Reported on 11/16/2022), Disp: 30 tablet, Rfl: 0   Vitamin D, Ergocalciferol, (DRISDOL) 1.25 MG (50000 UNIT) CAPS capsule, Take 1 capsule (50,000 Units total) by mouth every 7 (seven) days., Disp: 12 capsule, Rfl: 0   Medications ordered in this encounter:  Meds ordered this encounter  Medications   fluticasone (FLONASE) 50 MCG/ACT nasal spray    Sig: Place 2 sprays into both nostrils daily.    Dispense:  16 g    Refill:  0    Order Specific Question:   Supervising Provider    Answer:   Merrilee Jansky [[id number]]   azelastine (ASTELIN) 0.1 % nasal spray    Sig: Place 1 spray into both nostrils 2 (two) times daily. Use in each nostril as directed    Dispense:  30 mL    Refill:  0    Order Specific Question:   Supervising Provider    Answer:   Merrilee Jansky [[id number]]   olopatadine (PATANOL) 0.1 % ophthalmic solution    Sig: Place 1 drop into both eyes 2 (two) times daily.    Dispense:  5 mL    Refill:  1    Order Specific Question:   Supervising Provider    Answer:   Merrilee Jansky [[id number]]   levocetirizine (XYZAL) 5 MG tablet    Sig: Take 1 tablet (5 mg total) by mouth every evening.    Dispense:  90 tablet    Refill:  0    Order Specific Question:   Supervising Provider    Answer:   Merrilee JanskyLAMPTEY, Marc O [[id number]][[id number]]     *If you need refills on other medications prior to your next appointment, please contact your pharmacy*  Follow-Up: Call back or seek an in-person evaluation if the symptoms worsen or if the condition fails to improve as anticipated.  [facility] [phone]  Other Instructions  Allergies, Adult An allergy is a condition that causes the body's defense system (immune system) to react too strongly to an allergen. An allergen is a substance that is harmless to most people but can cause a reaction in some people. Allergies often affect the nose (allergic rhinitis),  eyes (conjunctivitis), skin (atopic dermatitis), and stomach. They can be mild, moderate, or severe. They cannot spread from person to person. Allergies can start at any age. In some cases, they may go away as you get older. What are the causes? Allergies are caused by allergens. These may be: Outdoor allergens. These include pollen, car fumes, and mold. Indoor allergens. These include dust, smoke, mold, and pet dander. Other allergens. These include foods, medicines, scents, and insect bites or stings. What increases the risk? You are more likely to have allergies if you have: Family members with allergies. Family members who have a condition that may be caused by allergens, such as asthma. What are the signs or symptoms? Symptoms depend on how severe your allergy is. Mild to moderate symptoms Runny nose, stuffy nose (nasal congestion), or sneezing. Itchy mouth, ears, or throat. Postnasal drip. This is a feeling of mucus dripping down the back of your throat. Sore throat. Itchy, red, watery, or puffy eyes. Skin rash, or itchy, red, swollen areas of skin (hives). Stomach cramps or bloating. Severe symptoms A bad allergy to food, medicine, or insect bites may cause a severe reaction (anaphylactic reaction). Symptoms include: A red face. Coughing or making high-pitched whistling sounds when you breathe, most often when you breathe out (wheezing). Swollen lips, tongue, or mouth. A tight or swollen throat. Chest pain or tightness, or a fast heartbeat. Trouble breathing or shortness of breath. Pain in your abdomen. Vomiting or diarrhea. Feeling dizzy or fainting. How is this diagnosed? Allergies are diagnosed based on your symptoms, your family and medical history, and a physical exam. You may also have tests, such as: Skin tests. These may be done to see how your skin reacts to allergens. Tests include: Skin prick test. For this test, the allergen is put in your body through a small  prick in the skin. Intradermal skin test. For this test, a small amount of the allergen is put under the first layer of your skin. Patch test. For this test, a small amount of the allergen is placed on your skin. The area is covered and then checked after a few days. Blood tests. A challenge test. For this test, you eat or breathe in the allergen to see if you have a reaction. You may also be asked to: Keep a food diary. This means writing down all the foods, drinks, and symptoms you have in a day. Try an elimination diet. To do this: Stop eating certain foods. Add those foods back one by one to find out if any of them cause a reaction. How is this treated?     Treatment for allergies depends on your symptoms. It may include: Cold, wet cloths (cold compresses). These can be used  to soothe itching and swelling. Eye drops or nasal sprays. A saline solution to clear out your nose and keep it moist (nasal irrigation). A saline solution is made of salt and water. A humidifier. This can add moisture to the air. Skin creams. These can treat rashes or itching. Diet changes to cut out foods that cause allergies. Being exposed again and again to tiny amounts of allergens. This can help your body build a defense against them (tolerance). This process is called immunotherapy. It may be done using: Allergy shots. This is when you get a shot of the allergen. Sublingual immunotherapy. This is when you take a small dose of the allergen under your tongue. Allergy medicines (antihistamines) or other medicines. These can help block the allergic reaction. Using an auto-injector pen. An auto-injector pen is a device filled with medicine that gives an emergency shot of epinephrine. Your health care provider will teach you how to use it. Follow these instructions at home: Medicines  Take or apply over-the-counter and prescription medicines only as told by your provider. Always carry your auto-injector pen if  you are at risk of an anaphylactic reaction. Give yourself the shot as told by your provider. Eating and drinking Follow instructions from your provider about what you may eat and drink. Drink enough fluid to keep your pee (urine) pale yellow. General instructions Wear a medical alert bracelet or necklace if you have had an anaphylactic reaction in the past. Avoid known allergens when you can. Keep all follow-up visits. Your provider will watch your symptoms and talk about treatment options with you. Contact a health care provider if: Your symptoms do not get better with treatment. Get help right away if: You have any symptoms of anaphylactic reaction. You use an auto-injector pen. You will need more medical care even if the medicine seems to be working. An anaphylactic reaction may happen again within 72 hours (rebound anaphylaxis). These symptoms may be an emergency. Use the auto-injector pen right away. Then call 911. Do not wait to see if the symptoms will go away. Do not drive yourself to the hospital. This information is not intended to replace advice given to you by your health care provider. Make sure you discuss any questions you have with your health care provider. Document Revised: 05/03/2022 Document Reviewed: 05/03/2022 Elsevier Patient Education  2023 Elsevier Inc.    If you have been instructed to have an in-person evaluation today at a local Urgent Care facility, please use the link below. It will take you to a list of all of our available [facility], including address, phone number and hours of operation. Please do not delay care.  [facility]  If you or a family member do not have a primary care provider, use the link below to schedule a visit and establish care. When you choose a [facility] primary care physician or advanced practice provider, you gain a long-term partner in health. Find a Primary Care Provider  Learn more about [facility]'s in-office and virtual care options: [facility] - Get Care Now

## 2022-12-12 NOTE — Progress Notes (Signed)
Virtual Visit Consent   Marc Sandoval, you are scheduled for a virtual visit with a [facility] provider today. Just as with appointments in the office, your consent must be obtained to participate. Your consent will be active for this visit and any virtual visit you may have with one of our providers in the next 365 days. If you have a MyChart account, a copy of this consent can be sent to you electronically.  As this is a virtual visit, video technology does not allow for your provider to perform a traditional examination. This may limit your provider's ability to fully assess your condition. If your provider identifies any concerns that need to be evaluated in person or the need to arrange testing (such as labs, EKG, etc.), we will make arrangements to do so. Although advances in technology are sophisticated, we cannot ensure that it will always work on either your end or our end. If the connection with a video visit is poor, the visit may have to be switched to a telephone visit. With either a video or telephone visit, we are not always able to ensure that we have a secure connection.  By engaging in this virtual visit, you consent to the provision of healthcare and authorize for your insurance to be billed (if applicable) for the services provided during this visit. Depending on your insurance coverage, you may receive a charge related to this service.  I need to obtain your verbal consent now. Are you willing to proceed with your visit today? Marc Sandoval has provided verbal consent on 12/12/2022 for a virtual visit (video or telephone). [name], PA-C  Date: 12/12/2022 12:52 PM  Virtual Visit via Video Note   I, [name], connected with  Marc Sandoval  ([id number], Jul 12, 1991) on 12/12/22 at 12:45 PM EDT by a video-enabled telemedicine application and verified that I am speaking with the correct person using two identifiers.  Location: Patient: Virtual Visit Location  Patient: Home Provider: Virtual Visit Location Provider: Home Office   I discussed the limitations of evaluation and management by telemedicine and the availability of in person appointments. The patient expressed understanding and agreed to proceed.    History of Present Illness: Marc Sandoval is a 32 y.o. who identifies as a male who was assigned male at birth, and is being seen today for seasonal allergies. Has been trying Benadryl. Is from [place] and has not been used to pollen seasons here or suffered from allergies the way he has this year. He is having nasal congestion, sneezing, runny nose, post nasal drainage, eye itching, eye watering.    Problems:  Patient Active Problem List   Diagnosis Date Noted   Gonorrhea 03/31/2021   On pre-exposure prophylaxis for HIV 03/31/2021   Generalized anxiety disorder 10/15/2020   PTSD (post-traumatic stress disorder) 10/15/2020   Major depressive disorder, recurrent episode, moderate 09/06/2020   History of substance abuse 09/06/2020   Panic attack as reaction to stress 09/06/2020   Vitamin D deficiency 02/06/2020   Closed fracture of left ankle 10/13/2018    Allergies:  Allergies  Allergen Reactions   Dayquil [Pseudoephedrine-Apap-Dm] Hives    Reports cause hives, sweats, cold symptoms   Doxylamine-Phenylephrine-Apap Hives   Doxylamine-Phenylephrine-Apap Hives   Nyquil Hbp Cold & Flu [Dm-Doxylamine-Acetaminophen] Hives    Reports cause hives, sweats, cold symptoms   Vancomycin    Medications:  Current Outpatient Medications:    azelastine (ASTELIN) 0.1 % nasal spray, Place 1 spray into both nostrils 2 (  two) times daily. Use in each nostril as directed, Disp: 30 mL, Rfl: 0   fluticasone (FLONASE) 50 MCG/ACT nasal spray, Place 2 sprays into both nostrils daily., Disp: 16 g, Rfl: 0   levocetirizine (XYZAL) 5 MG tablet, Take 1 tablet (5 mg total) by mouth every evening., Disp: 90 tablet, Rfl: 0   olopatadine (PATANOL) 0.1 % ophthalmic  solution, Place 1 drop into both eyes 2 (two) times daily., Disp: 5 mL, Rfl: 1   busPIRone (BUSPAR) 5 MG tablet, Take 1 tablet (5 mg total) by mouth 2 (two) times daily., Disp: 120 tablet, Rfl: 1   emtricitabine-tenofovir AF (DESCOVY) 200-25 MG tablet, Take 1 tablet by mouth daily., Disp: 30 tablet, Rfl: 2   emtricitabine-tenofovir AF (DESCOVY) 200-25 MG tablet, Take 1 tablet by mouth daily., Disp: 30 tablet, Rfl: 2   hydrOXYzine (ATARAX) 25 MG tablet, Take 1 tablet (25 mg total) by mouth every 6 (six) hours. (Patient not taking: Reported on 11/16/2022), Disp: 12 tablet, Rfl: 0   ondansetron (ZOFRAN-ODT) 4 MG disintegrating tablet, Take 1 tablet (4 mg total) by mouth every 8 (eight) hours as needed for nausea or vomiting. (Patient not taking: Reported on 11/16/2022), Disp: 20 tablet, Rfl: 0   sertraline (ZOLOFT) 25 MG tablet, Take 1 tablet (25 mg total) by mouth daily. (Patient not taking: Reported on 11/16/2022), Disp: 30 tablet, Rfl: 0   Vitamin D, Ergocalciferol, (DRISDOL) 1.25 MG (50000 UNIT) CAPS capsule, Take 1 capsule (50,000 Units total) by mouth every 7 (seven) days., Disp: 12 capsule, Rfl: 0  Observations/Objective: Patient is well-developed, well-nourished in no acute distress.  Resting comfortably at home.  Head is normocephalic, atraumatic.  No labored breathing.  Speech is clear and coherent with logical content.  Patient is alert and oriented at baseline.    Assessment and Plan: 1. Seasonal allergies - fluticasone (FLONASE) 50 MCG/ACT nasal spray; Place 2 sprays into both nostrils daily.  Dispense: 16 g; Refill: 0 - azelastine (ASTELIN) 0.1 % nasal spray; Place 1 spray into both nostrils 2 (two) times daily. Use in each nostril as directed  Dispense: 30 mL; Refill: 0 - olopatadine (PATANOL) 0.1 % ophthalmic solution; Place 1 drop into both eyes 2 (two) times daily.  Dispense: 5 mL; Refill: 1 - levocetirizine (XYZAL) 5 MG tablet; Take 1 tablet (5 mg total) by mouth every evening.   Dispense: 90 tablet; Refill: 0  - Add Xyzal daily at bedtime - Add flonase and azelastine nasal sprays for nasal congestion and drainage - Add olopatadine eye drops for eye symptoms - Saline nasal rinses - Steam and humidifier - Echinacea essential oil - Seek further evaluation if symptoms do not improve over the next 14 days or if symptoms worsen at any time  Follow Up Instructions: I discussed the assessment and treatment plan with the patient. The patient was provided an opportunity to ask questions and all were answered. The patient agreed with the plan and demonstrated an understanding of the instructions.  A copy of instructions were sent to the patient via MyChart unless otherwise noted below.    The patient was advised to call back or seek an in-person evaluation if the symptoms worsen or if the condition fails to improve as anticipated.  Time:  I spent 12 minutes with the patient via telehealth technology discussing the above problems/concerns.    [name], PA-C

## 2022-12-13 ENCOUNTER — Other Ambulatory Visit: Payer: Self-pay

## 2022-12-13 ENCOUNTER — Other Ambulatory Visit (HOSPITAL_COMMUNITY): Payer: Self-pay

## 2022-12-13 ENCOUNTER — Encounter (HOSPITAL_COMMUNITY): Payer: Self-pay

## 2022-12-13 MED ORDER — DESCOVY 200-25 MG PO TABS: 1.0000 | ORAL_TABLET | Freq: Every day | ORAL | 2 refills | Status: DC

## 2022-12-14 ENCOUNTER — Other Ambulatory Visit (HOSPITAL_COMMUNITY): Payer: Self-pay

## 2022-12-14 ENCOUNTER — Ambulatory Visit (INDEPENDENT_AMBULATORY_CARE_PROVIDER_SITE_OTHER): Payer: Medicaid Other | Admitting: Primary Care

## 2022-12-14 ENCOUNTER — Other Ambulatory Visit: Payer: Self-pay

## 2022-12-14 VITALS — BP 134/78 | HR 79 | Temp 98.1°F | Resp 20 | Ht 77.0 in | Wt 167.0 lb

## 2022-12-14 DIAGNOSIS — F411 Generalized anxiety disorder: Secondary | ICD-10-CM

## 2022-12-14 LAB — LAB REPORT - SCANNED: EGFR: 90

## 2022-12-14 NOTE — Progress Notes (Signed)
Follow up / give updates from last appt.

## 2022-12-17 NOTE — Progress Notes (Signed)
[facility]  Marc Sandoval, is a 32 y.o. male  OVA:[id number]  OKH:[id number]  DOB - 05/15/1991  Chief Complaint  Patient presents with   Depression       Subjective:  . Marc Sandoval is a 32 y.o. male here today for a follow up visit for medication tolerance.  Previous visit placed on BuSpar for anxiety patient endorses really able to tell a difference .He feels like he is in a better place.  Patient has No headache, No chest pain, No abdominal pain - No Nausea, No new weakness tingling or numbness, No Cough - shortness of breath  No problems updated.  Allergies  Allergen Reactions   Dayquil [Pseudoephedrine-Apap-Dm] Hives    Reports cause hives, sweats, cold symptoms   Doxylamine-Phenylephrine-Apap Hives   Doxylamine-Phenylephrine-Apap Hives   Nyquil Hbp Cold & Flu [Dm-Doxylamine-Acetaminophen] Hives    Reports cause hives, sweats, cold symptoms   Vancomycin     Past Medical History:  Diagnosis Date   Anxiety    Depression    Hypertension    Migraines    PTSD (post-traumatic stress disorder)     Current Outpatient Medications on File Prior to Visit  Medication Sig Dispense Refill   busPIRone (BUSPAR) 5 MG tablet Take 1 tablet (5 mg total) by mouth 2 (two) times daily. 120 tablet 1   fluticasone (FLONASE) 50 MCG/ACT nasal spray Place 2 sprays into both nostrils daily. 16 g 0   Vitamin D, Ergocalciferol, (DRISDOL) 1.25 MG (50000 UNIT) CAPS capsule Take 1 capsule (50,000 Units total) by mouth every 7 (seven) days. 12 capsule 0   azelastine (ASTELIN) 0.1 % nasal spray Place 1 spray into both nostrils 2 (two) times daily. Use in each nostril as directed (Patient not taking: Reported on 12/14/2022) 30 mL 0   emtricitabine-tenofovir AF (DESCOVY) 200-25 MG tablet Take 1 tablet by mouth daily. (Patient not taking: Reported on 12/14/2022) 30 tablet 2   hydrOXYzine (ATARAX) 25 MG tablet Take 1 tablet (25 mg total) by mouth every 6 (six) hours. (Patient not taking:  Reported on 11/16/2022) 12 tablet 0   levocetirizine (XYZAL) 5 MG tablet Take 1 tablet (5 mg total) by mouth every evening. (Patient not taking: Reported on 12/14/2022) 90 tablet 0   olopatadine (PATANOL) 0.1 % ophthalmic solution Place 1 drop into both eyes 2 (two) times daily. (Patient not taking: Reported on 12/14/2022) 5 mL 1   ondansetron (ZOFRAN-ODT) 4 MG disintegrating tablet Take 1 tablet (4 mg total) by mouth every 8 (eight) hours as needed for nausea or vomiting. (Patient not taking: Reported on 11/16/2022) 20 tablet 0   [DISCONTINUED] dicyclomine (BENTYL) 20 MG tablet Take 1 tablet (20 mg total) by mouth 2 (two) times daily. 20 tablet 0   [DISCONTINUED] PARoxetine (PAXIL) 20 MG tablet Take 1 tablet (20 mg total) by mouth daily. 30 tablet 3   No current facility-administered medications on file prior to visit.    Objective:   Vitals:   12/14/22 0922  BP: 134/78  Pulse: 79  Resp: 20  Temp: 98.1 F (36.7 C)  TempSrc: Oral  SpO2: 98%  Weight: 167 lb (75.8 kg)  Height: 6\' 5"  (1.956 m)    Comprehensive ROS Pertinent positive and negative noted in HPI   Exam General appearance : Awake, alert, not in any distress. Speech Clear. Not toxic looking HEENT: Atraumatic and Normocephalic, pupils equally reactive to light and accomodation Neck: Supple, no JVD. No cervical lymphadenopathy.  Chest: Good air entry bilaterally, no added  sounds  CVS: S1 S2 regular, no murmurs.  Abdomen: Bowel sounds present, Non tender and not distended with no gaurding, rigidity or rebound. Extremities: B/L Lower Ext shows no edema, both legs are warm to touch Neurology: Awake alert, and oriented X 3, CN II-XII intact, Non focal Skin: No Rash  Data Review Lab Results  Component Value Date   HGBA1C 5.2 11/16/2022    Assessment & Plan  [name] was seen today for depression.  Diagnoses and all orders for this visit:  Generalized anxiety disorder BuSpar is working well for him no changes in  medications or problems taking it   Patient have been counseled extensively about nutrition and exercise. Other issues discussed during this visit include: low cholesterol diet, weight control and daily exercise, foot care, annual eye examinations at Ophthalmology, importance of adherence with medications and regular follow-up. We also discussed long term complications of uncontrolled diabetes and hypertension.   No follow-ups on file.  The patient was given clear instructions to go to ER or return to medical center if symptoms don't improve, worsen or new problems develop. The patient verbalized understanding. The patient was told to call to get lab results if they haven't heard anything in the next week.   This note has been created with Education officer, environmental. Any transcriptional errors are unintentional.   Marc Sessions, NP 12/17/2022, 3:36 PM

## 2022-12-21 ENCOUNTER — Encounter (INDEPENDENT_AMBULATORY_CARE_PROVIDER_SITE_OTHER): Payer: Medicaid Other | Admitting: Licensed Clinical Social Worker

## 2022-12-23 ENCOUNTER — Other Ambulatory Visit (HOSPITAL_COMMUNITY): Payer: Self-pay

## 2022-12-25 ENCOUNTER — Other Ambulatory Visit (HOSPITAL_COMMUNITY): Payer: Self-pay

## 2023-01-08 ENCOUNTER — Other Ambulatory Visit (INDEPENDENT_AMBULATORY_CARE_PROVIDER_SITE_OTHER): Payer: Self-pay | Admitting: Primary Care

## 2023-01-08 ENCOUNTER — Encounter: Payer: Self-pay | Admitting: Pharmacist

## 2023-01-08 ENCOUNTER — Other Ambulatory Visit (HOSPITAL_COMMUNITY): Payer: Self-pay

## 2023-01-08 ENCOUNTER — Other Ambulatory Visit: Payer: Self-pay

## 2023-01-08 DIAGNOSIS — J302 Other seasonal allergic rhinitis: Secondary | ICD-10-CM

## 2023-01-08 NOTE — Telephone Encounter (Signed)
Will forward to provider  

## 2023-01-10 ENCOUNTER — Other Ambulatory Visit (HOSPITAL_COMMUNITY): Payer: Self-pay

## 2023-01-10 ENCOUNTER — Other Ambulatory Visit (INDEPENDENT_AMBULATORY_CARE_PROVIDER_SITE_OTHER): Payer: Self-pay | Admitting: Primary Care

## 2023-01-10 DIAGNOSIS — J302 Other seasonal allergic rhinitis: Secondary | ICD-10-CM

## 2023-01-10 MED ORDER — AZELASTINE HCL 0.1 % NA SOLN
1.0000 | Freq: Two times a day (BID) | NASAL | 0 refills | Status: DC
  Filled 2023-01-10: qty 30, 34d supply, fill #0

## 2023-01-10 MED ORDER — FLUTICASONE PROPIONATE 50 MCG/ACT NA SUSP: 2.0000 | Freq: Every day | NASAL | 0 refills | Status: DC

## 2023-01-10 NOTE — Telephone Encounter (Signed)
Requested Prescriptions  Pending Prescriptions Disp Refills   fluticasone (FLONASE) 50 MCG/ACT nasal spray [Pharmacy Med Name: FLUTICASONE NASAL SP (120) RX] 48 g     Sig: SHAKE LIQUID AND USE 2 SPRAYS IN EACH NOSTRIL DAILY     Ear, Nose, and Throat: Nasal Preparations - Corticosteroids Passed - 01/10/2023  2:55 PM      Passed - Valid encounter within last 12 months    Recent Outpatient Visits           3 weeks ago Generalized anxiety disorder   [facility] Grayce Sessions, NP   1 month ago Major depressive disorder, recurrent episode, moderate (HCC)   [facility] Grayce Sessions, NP   2 years ago PTSD (post-traumatic stress disorder)   [facility] Hoy Register, MD   2 years ago Annual physical exam   [facility], Kandee Keen, MD   3 years ago Adjustment disorder with mixed anxiety and depressed mood   [facility], LCSW       Future Appointments             In 5 months Randa Evens, Kinnie Scales, NP [facility]

## 2023-01-11 ENCOUNTER — Other Ambulatory Visit: Payer: Self-pay

## 2023-01-12 ENCOUNTER — Other Ambulatory Visit: Payer: Self-pay

## 2023-01-31 ENCOUNTER — Other Ambulatory Visit (HOSPITAL_COMMUNITY): Payer: Self-pay

## 2023-03-12 ENCOUNTER — Other Ambulatory Visit (HOSPITAL_COMMUNITY): Payer: Self-pay

## 2023-03-13 ENCOUNTER — Other Ambulatory Visit (HOSPITAL_COMMUNITY): Payer: Self-pay

## 2023-03-13 MED ORDER — EMTRICITABINE-TENOFOVIR AF 200-25 MG PO TABS
1.0000 | ORAL_TABLET | Freq: Every day | ORAL | 2 refills | Status: DC
  Filled 2023-03-13: qty 30, 30d supply, fill #0
  Filled 2023-05-02: qty 30, 30d supply, fill #1
  Filled 2023-06-09: qty 30, 30d supply, fill #2

## 2023-03-15 ENCOUNTER — Other Ambulatory Visit (HOSPITAL_COMMUNITY): Payer: Self-pay

## 2023-03-15 ENCOUNTER — Other Ambulatory Visit: Payer: Self-pay

## 2023-03-31 ENCOUNTER — Other Ambulatory Visit (HOSPITAL_COMMUNITY): Payer: Self-pay

## 2023-03-31 MED ORDER — DESCOVY 200-25 MG PO TABS
1.0000 | ORAL_TABLET | Freq: Every day | ORAL | 2 refills | Status: DC
  Filled 2023-03-31 – 2023-05-18 (×2): qty 30, 30d supply, fill #0

## 2023-05-02 ENCOUNTER — Other Ambulatory Visit (HOSPITAL_COMMUNITY): Payer: Self-pay

## 2023-05-18 ENCOUNTER — Other Ambulatory Visit (HOSPITAL_COMMUNITY): Payer: Self-pay

## 2023-05-21 ENCOUNTER — Other Ambulatory Visit: Payer: Self-pay

## 2023-05-31 ENCOUNTER — Ambulatory Visit: Payer: Medicaid Other

## 2023-06-09 ENCOUNTER — Other Ambulatory Visit (HOSPITAL_COMMUNITY): Payer: Self-pay

## 2023-06-11 ENCOUNTER — Other Ambulatory Visit (HOSPITAL_COMMUNITY): Payer: Self-pay

## 2023-06-11 MED ORDER — DESCOVY 200-25 MG PO TABS: 1.0000 | ORAL_TABLET | Freq: Every day | ORAL | 2 refills | Status: DC

## 2023-06-12 ENCOUNTER — Other Ambulatory Visit: Payer: Self-pay

## 2023-06-12 ENCOUNTER — Other Ambulatory Visit (HOSPITAL_COMMUNITY): Payer: Self-pay

## 2023-06-13 ENCOUNTER — Other Ambulatory Visit: Payer: Self-pay

## 2023-06-13 NOTE — Progress Notes (Signed)
Patient has previously opted out of specialty pharmacy services. Opt out documentation in [employer] from 05/19/22.

## 2023-06-15 ENCOUNTER — Ambulatory Visit (INDEPENDENT_AMBULATORY_CARE_PROVIDER_SITE_OTHER): Payer: Self-pay | Admitting: Primary Care

## 2023-06-15 ENCOUNTER — Encounter (INDEPENDENT_AMBULATORY_CARE_PROVIDER_SITE_OTHER): Payer: Self-pay

## 2023-06-22 ENCOUNTER — Other Ambulatory Visit (HOSPITAL_COMMUNITY): Payer: Self-pay

## 2023-06-30 ENCOUNTER — Other Ambulatory Visit (HOSPITAL_COMMUNITY): Payer: Self-pay

## 2023-07-18 ENCOUNTER — Other Ambulatory Visit (HOSPITAL_COMMUNITY): Payer: Self-pay

## 2023-07-19 ENCOUNTER — Other Ambulatory Visit: Payer: Self-pay

## 2023-07-21 ENCOUNTER — Other Ambulatory Visit (HOSPITAL_COMMUNITY): Payer: Self-pay

## 2023-07-21 MED ORDER — DESCOVY 200-25 MG PO TABS
1.0000 | ORAL_TABLET | Freq: Every day | ORAL | 2 refills | Status: AC
  Filled 2023-07-21: qty 30, 30d supply, fill #0
  Filled 2023-09-07: qty 30, 30d supply, fill #1

## 2023-07-23 ENCOUNTER — Other Ambulatory Visit: Payer: Self-pay

## 2023-09-07 ENCOUNTER — Other Ambulatory Visit: Payer: Self-pay

## 2023-09-08 ENCOUNTER — Other Ambulatory Visit: Payer: Self-pay

## 2023-09-08 ENCOUNTER — Other Ambulatory Visit (HOSPITAL_COMMUNITY): Payer: Self-pay

## 2023-09-10 ENCOUNTER — Other Ambulatory Visit: Payer: Self-pay

## 2023-09-11 ENCOUNTER — Other Ambulatory Visit: Payer: Self-pay

## 2023-09-27 ENCOUNTER — Telehealth: Payer: Medicaid Other | Admitting: Nurse Practitioner

## 2023-09-27 DIAGNOSIS — B001 Herpesviral vesicular dermatitis: Secondary | ICD-10-CM

## 2023-09-27 MED ORDER — ACYCLOVIR 800 MG PO TABS: 800.0000 mg | ORAL_TABLET | Freq: Two times a day (BID) | ORAL | 0 refills | Status: AC

## 2023-09-27 NOTE — Progress Notes (Signed)
Virtual Visit Consent   Marc Sandoval, you are scheduled for a virtual visit with a  provider today. Just as with appointments in the office, your consent must be obtained to participate. Your consent will be active for this visit and any virtual visit you may have with one of our providers in the next 365 days. If you have a MyChart account, a copy of this consent can be sent to you electronically.  As this is a virtual visit, video technology does not allow for your provider to perform a traditional examination. This may limit your provider's ability to fully assess your condition. If your provider identifies any concerns that need to be evaluated in person or the need to arrange testing (such as labs, EKG, etc.), we will make arrangements to do so. Although advances in technology are sophisticated, we cannot ensure that it will always work on either your end or our end. If the connection with a video visit is poor, the visit may have to be switched to a telephone visit. With either a video or telephone visit, we are not always able to ensure that we have a secure connection.  By engaging in this virtual visit, you consent to the provision of healthcare and authorize for your insurance to be billed (if applicable) for the services provided during this visit. Depending on your insurance coverage, you may receive a charge related to this service.  I need to obtain your verbal consent now. Are you willing to proceed with your visit today? Marc Sandoval has provided verbal consent on 09/27/2023 for a virtual visit (video or telephone). Viviano Simas, FNP  Date: 09/27/2023 6:10 PM  Virtual Visit via Video Note   I, Viviano Simas, connected with  Marc Sandoval  ([id number], 02/06/1991) on 09/27/23 at  6:15 PM EST by a video-enabled telemedicine application and verified that I am speaking with the correct person using two identifiers.  Location: Patient: Virtual Visit Location Patient:  Home Provider: Virtual Visit Location Provider: Home Office   I discussed the limitations of evaluation and management by telemedicine and the availability of in person appointments. The patient expressed understanding and agreed to proceed.    History of Present Illness: Marc Sandoval is a 33 y.o. who identifies as a male who was assigned male at birth, and is being seen today for a new lesion on his lip  2 days ago he had a split in his lip that he was concerned might be a cold sore   His lips have been dry recently  He has been using lubricants but feels the area is tender now   Denies a known history of genital or oral herpes   Problems:  Patient Active Problem List   Diagnosis Date Noted   Gonorrhea 03/31/2021   On pre-exposure prophylaxis for HIV 03/31/2021   Generalized anxiety disorder 10/15/2020   PTSD (post-traumatic stress disorder) 10/15/2020   Major depressive disorder, recurrent episode, moderate (HCC) 09/06/2020   History of substance abuse (HCC) 09/06/2020   Panic attack as reaction to stress 09/06/2020   Vitamin D deficiency 02/06/2020   Closed fracture of left ankle 10/13/2018    Allergies:  Allergies  Allergen Reactions   Dayquil [Pseudoephedrine-Apap-Dm] Hives    Reports cause hives, sweats, cold symptoms   Doxylamine-Phenylephrine-Apap Hives   Doxylamine-Phenylephrine-Apap Hives   Nyquil Hbp Cold & Flu [Dm-Doxylamine-Acetaminophen] Hives    Reports cause hives, sweats, cold symptoms   Vancomycin    Medications:  Current Outpatient Medications:  azelastine (ASTELIN) 0.1 % nasal spray, Place 1 spray into both nostrils 2 (two) times daily as directed, Disp: 30 mL, Rfl: 0   busPIRone (BUSPAR) 5 MG tablet, Take 1 tablet (5 mg total) by mouth 2 (two) times daily., Disp: 120 tablet, Rfl: 1   emtricitabine-tenofovir AF (DESCOVY) 200-25 MG tablet, Take 1 tablet by mouth daily., Disp: 30 tablet, Rfl: 2   emtricitabine-tenofovir AF (DESCOVY) 200-25 MG tablet,  Take 1 tablet by mouth daily., Disp: 30 tablet, Rfl: 2   emtricitabine-tenofovir AF (DESCOVY) 200-25 MG tablet, Take 1 tablet by mouth daily., Disp: 30 tablet, Rfl: 2   emtricitabine-tenofovir AF (DESCOVY) 200-25 MG tablet, Take 1 tablet by mouth daily., Disp: 30 tablet, Rfl: 2   fluticasone (FLONASE) 50 MCG/ACT nasal spray, Place 2 sprays into both nostrils daily., Disp: 16 g, Rfl: 0   hydrOXYzine (ATARAX) 25 MG tablet, Take 1 tablet (25 mg total) by mouth every 6 (six) hours. (Patient not taking: Reported on 11/16/2022), Disp: 12 tablet, Rfl: 0   levocetirizine (XYZAL) 5 MG tablet, Take 1 tablet (5 mg total) by mouth every evening. (Patient not taking: Reported on 12/14/2022), Disp: 90 tablet, Rfl: 0   olopatadine (PATANOL) 0.1 % ophthalmic solution, Place 1 drop into both eyes 2 (two) times daily. (Patient not taking: Reported on 12/14/2022), Disp: 5 mL, Rfl: 1   ondansetron (ZOFRAN-ODT) 4 MG disintegrating tablet, Take 1 tablet (4 mg total) by mouth every 8 (eight) hours as needed for nausea or vomiting. (Patient not taking: Reported on 11/16/2022), Disp: 20 tablet, Rfl: 0   Vitamin D, Ergocalciferol, (DRISDOL) 1.25 MG (50000 UNIT) CAPS capsule, Take 1 capsule (50,000 Units total) by mouth every 7 (seven) days., Disp: 12 capsule, Rfl: 0  Observations/Objective: Patient is well-developed, well-nourished in no acute distress.  Resting comfortably  at home.  Head is normocephalic, atraumatic.  No labored breathing.  Speech is clear and coherent with logical content.  Patient is alert and oriented at baseline.    Assessment and Plan:  1. Cold sore (Primary) Discussed how to get tested  Keeping lips moisturized  Adding humidifier in bedroom   - acyclovir (ZOVIRAX) 800 MG tablet; Take 1 tablet (800 mg total) by mouth 2 (two) times daily for 7 days.  Dispense: 14 tablet; Refill: 0      Follow Up Instructions: I discussed the assessment and treatment plan with the patient. The patient was  provided an opportunity to ask questions and all were answered. The patient agreed with the plan and demonstrated an understanding of the instructions.  A copy of instructions were sent to the patient via MyChart unless otherwise noted below.    The patient was advised to call back or seek an in-person evaluation if the symptoms worsen or if the condition fails to improve as anticipated.    Viviano Simas, FNP

## 2023-10-17 ENCOUNTER — Telehealth: Payer: Medicaid Other | Admitting: Family

## 2023-10-17 DIAGNOSIS — F5104 Psychophysiologic insomnia: Secondary | ICD-10-CM | POA: Diagnosis not present

## 2023-10-17 MED ORDER — TRAZODONE HCL 100 MG PO TABS: 100.0000 mg | ORAL_TABLET | Freq: Every day | ORAL | 1 refills | Status: AC
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
## 2023-10-18 ENCOUNTER — Telehealth: Payer: Medicaid Other | Admitting: Family Medicine

## 2023-10-18 ENCOUNTER — Encounter: Payer: Self-pay | Admitting: Family Medicine

## 2023-10-18 NOTE — Progress Notes (Signed)
The patient no-showed for appointment despite this provider sending direct link, reaching out via phone with no response and waiting for at least 10 minutes from appointment time for patient to join. They will be marked as a NS for this appointment/time.   Freddy Finner, NP

## 2023-11-30 ENCOUNTER — Other Ambulatory Visit (INDEPENDENT_AMBULATORY_CARE_PROVIDER_SITE_OTHER): Payer: Self-pay | Admitting: Primary Care

## 2023-11-30 DIAGNOSIS — J302 Other seasonal allergic rhinitis: Secondary | ICD-10-CM

## 2023-12-01 ENCOUNTER — Other Ambulatory Visit (HOSPITAL_COMMUNITY): Payer: Self-pay

## 2023-12-03 ENCOUNTER — Other Ambulatory Visit: Payer: Self-pay

## 2023-12-03 ENCOUNTER — Other Ambulatory Visit (HOSPITAL_COMMUNITY): Payer: Self-pay

## 2023-12-03 MED ORDER — AZELASTINE HCL 0.1 % NA SOLN
1.0000 | Freq: Two times a day (BID) | NASAL | 0 refills | Status: AC
  Filled 2023-12-03 – 2023-12-06 (×2): qty 30, 34d supply, fill #0

## 2023-12-03 NOTE — Telephone Encounter (Signed)
 Requested medication (s) are due for refill today: yes  Requested medication (s) are on the active medication list: yes  Last refill:  11/23/22  Future visit scheduled:no  Notes to clinic:  Manual Review: Route requests for 50,000 IU strength to the provider      Requested Prescriptions  Pending Prescriptions Disp Refills   Vitamin D, Ergocalciferol, (DRISDOL) 1.25 MG (50000 UNIT) CAPS capsule 12 capsule 0    Sig: Take 1 capsule (50,000 Units total) by mouth every 7 (seven) days.     Endocrinology:  Vitamins - Vitamin D Supplementation 2 Failed - 12/03/2023  3:39 PM      Failed - Manual Review: Route requests for 50,000 IU strength to the provider      Failed - Ca in normal range and within 360 days    Calcium  Date Value Ref Range Status  11/16/2022 9.1 8.7 - 10.2 mg/dL Final         Failed - Vitamin D in normal range and within 360 days    Vit D, 25-Hydroxy  Date Value Ref Range Status  11/16/2022 8.0 (L) 30.0 - 100.0 ng/mL Final    Comment:    Vitamin D deficiency has been defined by the Institute of Medicine and an Endocrine Society practice guideline as a level of serum 25-OH vitamin D less than 20 ng/mL (1,2). The Endocrine Society went on to further define vitamin D insufficiency as a level between 21 and 29 ng/mL (2). 1. IOM (Institute of Medicine). 2010. Dietary reference    intakes for calcium and D. [place]: The    [employer]. 2. Holick MF, Binkley [place], Bischoff-Ferrari HA, et al.    Evaluation, treatment, and prevention of vitamin D    deficiency: an Endocrine Society clinical practice    guideline. JCEM. 2011 Jul; 96(7):1911-30.          Passed - Valid encounter within last 12 months    Recent Outpatient Visits           11 months ago Generalized anxiety disorder   [place] [facility] Grayce Sessions, NP   1 year ago Major depressive disorder, recurrent episode, moderate (HCC)   [place] [facility] Grayce Sessions, NP   3 years ago PTSD (post-traumatic stress disorder)   [facility] - A Dept Of [place]. [facility] Hoy Register, MD   3 years ago Annual physical exam   [place] [facility], Kandee Keen, DO   4 years ago Adjustment disorder with mixed anxiety and depressed mood   [place] [facility], [place], [place]              Signed Prescriptions Disp Refills   azelastine (ASTELIN) 0.1 % nasal spray 30 mL 0    Sig: Place 1 spray into both nostrils 2 (two) times daily as directed     Ear, Nose, and Throat: Nasal Preparations - Antiallergy Passed - 12/03/2023  3:39 PM      Passed - Valid encounter within last 12 months    Recent Outpatient Visits           11 months ago Generalized anxiety disorder   [place] [facility] Grayce Sessions, NP   1 year ago Major depressive disorder, recurrent episode, moderate (HCC)   [place] [facility] Grayce Sessions, NP   3 years ago  PTSD (post-traumatic stress disorder)   [facility] - A Dept Of [place]. [facility] Hoy Register, MD   3 years ago Annual physical exam   [facility], Kandee Keen, DO   4 years ago Adjustment disorder with mixed anxiety and depressed mood   [facility], [place], [place]

## 2023-12-03 NOTE — Telephone Encounter (Signed)
 Requested Prescriptions  Pending Prescriptions Disp Refills   Vitamin D, Ergocalciferol, (DRISDOL) 1.25 MG (50000 UNIT) CAPS capsule 12 capsule 0    Sig: Take 1 capsule (50,000 Units total) by mouth every 7 (seven) days.     Endocrinology:  Vitamins - Vitamin D Supplementation 2 Failed - 12/03/2023  3:35 PM      Failed - Manual Review: Route requests for 50,000 IU strength to the provider      Failed - Ca in normal range and within 360 days    Calcium  Date Value Ref Range Status  11/16/2022 9.1 8.7 - 10.2 mg/dL Final         Failed - Vitamin D in normal range and within 360 days    Vit D, 25-Hydroxy  Date Value Ref Range Status  11/16/2022 8.0 (L) 30.0 - 100.0 ng/mL Final    Comment:    Vitamin D deficiency has been defined by the Institute of Medicine and an Endocrine Society practice guideline as a level of serum 25-OH vitamin D less than 20 ng/mL (1,2). The Endocrine Society went on to further define vitamin D insufficiency as a level between 21 and 29 ng/mL (2). 1. IOM (Institute of Medicine). 2010. Dietary reference    intakes for calcium and D. [place]: The    [employer]. 2. Holick MF, Binkley [place], Bischoff-Ferrari HA, et al.    Evaluation, treatment, and prevention of vitamin D    deficiency: an Endocrine Society clinical practice    guideline. JCEM. 2011 Jul; 96(7):1911-30.          Passed - Valid encounter within last 12 months    Recent Outpatient Visits           11 months ago Generalized anxiety disorder   [place] [facility] Grayce Sessions, NP   1 year ago Major depressive disorder, recurrent episode, moderate (HCC)   [place] [facility] Grayce Sessions, NP   3 years ago PTSD (post-traumatic stress disorder)   [facility] - A Dept Of [place]. [facility] Rafael Gonzalez, Odette Horns, MD   3 years ago Annual physical exam   [facility],  Kandee Keen, DO   4 years ago Adjustment disorder with mixed anxiety and depressed mood   [place] [facility], [place] D, LCSW               azelastine (ASTELIN) 0.1 % nasal spray 30 mL 0    Sig: Place 1 spray into both nostrils 2 (two) times daily as directed     Ear, Nose, and Throat: Nasal Preparations - Antiallergy Passed - 12/03/2023  3:35 PM      Passed - Valid encounter within last 12 months    Recent Outpatient Visits           11 months ago Generalized anxiety disorder   [place] [facility] Grayce Sessions, NP   1 year ago Major depressive disorder, recurrent episode, moderate (HCC)   [place] [facility] Grayce Sessions, NP   3 years ago PTSD (post-traumatic stress disorder)   [place] [facility] - A Dept Of [place]. [facility] Hoy Register, MD   3 years ago Annual physical exam   [facility] at [facility], Kandee Keen, DO   4 years ago Adjustment disorder with mixed anxiety and  depressed mood   [place] [facility], [place], [place]

## 2023-12-04 ENCOUNTER — Other Ambulatory Visit: Payer: Self-pay

## 2023-12-06 ENCOUNTER — Other Ambulatory Visit (HOSPITAL_COMMUNITY): Payer: Self-pay

## 2023-12-06 ENCOUNTER — Other Ambulatory Visit: Payer: Self-pay

## 2023-12-07 ENCOUNTER — Other Ambulatory Visit (HOSPITAL_COMMUNITY): Payer: Self-pay

## 2023-12-07 MED ORDER — FLUTICASONE PROPIONATE 50 MCG/ACT NA SUSP
2.0000 | Freq: Every day | NASAL | 0 refills | Status: AC
  Filled 2023-12-07: qty 16, 30d supply, fill #0

## 2023-12-08 ENCOUNTER — Other Ambulatory Visit (HOSPITAL_COMMUNITY): Payer: Self-pay

## 2024-07-29 ENCOUNTER — Encounter: Payer: Self-pay | Admitting: Physician Assistant

## 2024-07-29 ENCOUNTER — Telehealth: Admitting: Physician Assistant

## 2024-07-29 DIAGNOSIS — T63301A Toxic effect of unspecified spider venom, accidental (unintentional), initial encounter: Secondary | ICD-10-CM | POA: Diagnosis not present

## 2024-07-29 MED ORDER — CEPHALEXIN 500 MG PO CAPS: 500.0000 mg | ORAL_CAPSULE | Freq: Four times a day (QID) | ORAL | 0 refills | Status: AC

## 2024-07-29 NOTE — Patient Instructions (Signed)
 Ila Pears, thank you for joining Elsie Velma Lunger, PA-C for today's virtual visit.  While this provider is not your primary care provider (PCP), if your PCP is located in our provider database this encounter information will be shared with them immediately following your visit.   A [facility] MyChart account gives you access to today's visit and all your visits, tests, and labs performed at [facility]  click here if you don't have a [facility] MyChart account or go to [email or web address]  Consent: (Patient) Zykee Avakian provided verbal consent for this virtual visit at the beginning of the encounter.  Current Medications:  Current Outpatient Medications:    azelastine  (ASTELIN ) 0.1 % nasal spray, Place 1 spray into both nostrils 2 (two) times daily as directed, Disp: 30 mL, Rfl: 0   busPIRone  (BUSPAR ) 5 MG tablet, Take 1 tablet (5 mg total) by mouth 2 (two) times daily. (Patient not taking: Reported on 10/17/2023), Disp: 120 tablet, Rfl: 1   emtricitabine -tenofovir  AF (DESCOVY ) 200-25 MG tablet, Take 1 tablet by mouth daily. (Patient not taking: Reported on 10/17/2023), Disp: 30 tablet, Rfl: 2   emtricitabine -tenofovir  AF (DESCOVY ) 200-25 MG tablet, Take 1 tablet by mouth daily. (Patient not taking: Reported on 10/17/2023), Disp: 30 tablet, Rfl: 2   emtricitabine -tenofovir  AF (DESCOVY ) 200-25 MG tablet, Take 1 tablet by mouth daily. (Patient not taking: Reported on 10/17/2023), Disp: 30 tablet, Rfl: 2   emtricitabine -tenofovir  AF (DESCOVY ) 200-25 MG tablet, Take 1 tablet by mouth daily. (Patient not taking: Reported on 10/17/2023), Disp: 30 tablet, Rfl: 2   fluticasone  (FLONASE ) 50 MCG/ACT nasal spray, Place 2 sprays into both nostrils daily., Disp: 16 g, Rfl: 0   hydrOXYzine  (ATARAX ) 25 MG tablet, Take 1 tablet (25 mg total) by mouth every 6 (six) hours. (Patient not taking: Reported on 10/17/2023), Disp: 12 tablet, Rfl: 0   levocetirizine (XYZAL ) 5 MG tablet, Take 1  tablet (5 mg total) by mouth every evening. (Patient not taking: Reported on 10/17/2023), Disp: 90 tablet, Rfl: 0   olopatadine  (PATANOL) 0.1 % ophthalmic solution, Place 1 drop into both eyes 2 (two) times daily. (Patient not taking: Reported on 10/17/2023), Disp: 5 mL, Rfl: 1   ondansetron  (ZOFRAN -ODT) 4 MG disintegrating tablet, Take 1 tablet (4 mg total) by mouth every 8 (eight) hours as needed for nausea or vomiting. (Patient not taking: Reported on 10/17/2023), Disp: 20 tablet, Rfl: 0   traZODone  (DESYREL ) 100 MG tablet, Take 1-2 tablets (100-200 mg total) by mouth at bedtime., Disp: 180 tablet, Rfl: 1   Vitamin D , Ergocalciferol , (DRISDOL ) 1.25 MG (50000 UNIT) CAPS capsule, Take 1 capsule (50,000 Units total) by mouth every 7 (seven) days. (Patient not taking: Reported on 10/17/2023), Disp: 12 capsule, Rfl: 0   Medications ordered in this encounter:  No orders of the defined types were placed in this encounter.    *If you need refills on other medications prior to your next appointment, please contact your pharmacy*  Follow-Up: Call back or seek an in-person evaluation if the symptoms worsen or if the condition fails to improve as anticipated.  [facility] [phone]  Other Instructions Please keep the skin clean and dry Ok to continue your OTC medications. Take the antibiotic as directed. If no substantial improvement over next 48-72 hours or anything worsening after 24 hours on the antibiotic, you need to seek an in-person evaluation ASAP.   If you have been instructed to have an in-person evaluation today at a local Urgent Care  facility, please use the link below. It will take you to a list of all of our available [facility], including address, phone number and hours of operation. Please do not delay care.  [facility]  If you or a family member do not have a primary care provider, use the link below to schedule a visit and establish care.  When you choose a [facility] primary care physician or advanced practice provider, you gain a long-term partner in health. Find a Primary Care Provider  Learn more about [facility]'s in-office and virtual care options: [place] - Get Care Now

## 2024-07-29 NOTE — Progress Notes (Signed)
 Virtual Visit Consent   Marc Sandoval, you are scheduled for a virtual visit with a [facility] provider today. Just as with appointments in the office, your consent must be obtained to participate. Your consent will be active for this visit and any virtual visit you may have with one of our providers in the next 365 days. If you have a MyChart account, a copy of this consent can be sent to you electronically.  As this is a virtual visit, video technology does not allow for your provider to perform a traditional examination. This may limit your provider's ability to fully assess your condition. If your provider identifies any concerns that need to be evaluated in person or the need to arrange testing (such as labs, EKG, etc.), we will make arrangements to do so. Although advances in technology are sophisticated, we cannot ensure that it will always work on either your end or our end. If the connection with a video visit is poor, the visit may have to be switched to a telephone visit. With either a video or telephone visit, we are not always able to ensure that we have a secure connection.  By engaging in this virtual visit, you consent to the provision of healthcare and authorize for your insurance to be billed (if applicable) for the services provided during this visit. Depending on your insurance coverage, you may receive a charge related to this service.  I need to obtain your verbal consent now. Are you willing to proceed with your visit today? Marc Sandoval has provided verbal consent on 07/29/2024 for a virtual visit (video or telephone). Marc Sandoval, [place]  Date: 07/29/2024 3:21 PM   Virtual Visit via Video Note   I, Marc Sandoval, connected with  Marc Sandoval  ([id number], [birth year]) on 07/29/24 at  3:15 PM EST by a video-enabled telemedicine application and verified that I am speaking with the correct person using two identifiers.  Location: Patient: Virtual Visit Location  Patient: Home Provider: Virtual Visit Location Provider: Home Office   I discussed the limitations of evaluation and management by telemedicine and the availability of in person appointments. The patient expressed understanding and agreed to proceed.    History of Present Illness: Marc Sandoval is a 33 y.o. who identifies as a male who was assigned male at birth, and is being seen today for bite to leg this past Monday.  Recently traveled to and from [place] , staying in a hotel while out of town. At that time he noted a bite on his R ankle, which he first thought may have been bed bugs but when packing up he founds several spiders in his hotel room. Took a benadryl and applied some OTC ointment to the area. Still red and itchy with occasional drainage. Is keeping clean and dry but unsure if anything else is needed.    HPI: HPI  Problems:  Patient Active Problem List   Diagnosis Date Noted   Gonorrhea 03/31/2021   On pre-exposure prophylaxis for HIV 03/31/2021   Generalized anxiety disorder 10/15/2020   PTSD (post-traumatic stress disorder) 10/15/2020   Major depressive disorder, recurrent episode, moderate (HCC) 09/06/2020   History of substance abuse (HCC) 09/06/2020   Panic attack as reaction to stress 09/06/2020   Vitamin D  deficiency 02/06/2020   Closed fracture of left ankle 10/13/2018    Allergies:  Allergies  Allergen Reactions   Dayquil [Pseudoephedrine-Apap-Dm] Hives    Reports cause hives, sweats, cold symptoms   Doxylamine-Phenylephrine-Apap Hives  Doxylamine-Phenylephrine-Apap Hives   Nyquil Hbp Cold & Flu [Dm-Doxylamine-Acetaminophen ] Hives    Reports cause hives, sweats, cold symptoms   Vancomycin    Medications:  Current Outpatient Medications:    cephALEXin  (KEFLEX ) 500 MG capsule, Take 1 capsule (500 mg total) by mouth 4 (four) times daily for 5 days., Disp: 20 capsule, Rfl: 0   azelastine  (ASTELIN ) 0.1 % nasal spray, Place 1 spray into both nostrils 2  (two) times daily as directed, Disp: 30 mL, Rfl: 0   busPIRone  (BUSPAR ) 5 MG tablet, Take 1 tablet (5 mg total) by mouth 2 (two) times daily. (Patient not taking: Reported on 10/17/2023), Disp: 120 tablet, Rfl: 1   emtricitabine -tenofovir  AF (DESCOVY ) 200-25 MG tablet, Take 1 tablet by mouth daily. (Patient not taking: Reported on 10/17/2023), Disp: 30 tablet, Rfl: 2   fluticasone  (FLONASE ) 50 MCG/ACT nasal spray, Place 2 sprays into both nostrils daily., Disp: 16 g, Rfl: 0   hydrOXYzine  (ATARAX ) 25 MG tablet, Take 1 tablet (25 mg total) by mouth every 6 (six) hours. (Patient not taking: Reported on 10/17/2023), Disp: 12 tablet, Rfl: 0   traZODone  (DESYREL ) 100 MG tablet, Take 1-2 tablets (100-200 mg total) by mouth at bedtime., Disp: 180 tablet, Rfl: 1   Vitamin D , Ergocalciferol , (DRISDOL ) 1.25 MG (50000 UNIT) CAPS capsule, Take 1 capsule (50,000 Units total) by mouth every 7 (seven) days. (Patient not taking: Reported on 10/17/2023), Disp: 12 capsule, Rfl: 0  Observations/Objective: Patient is well-developed, well-nourished in no acute distress.  Resting comfortably  at home.  Head is normocephalic, atraumatic.  No labored breathing. Speech is clear and coherent with logical content.  Patient is alert and oriented at baseline.    Assessment and Plan: 1. Spider bite wound, accidental or unintentional, initial encounter (Primary) - cephALEXin  (KEFLEX ) 500 MG capsule; Take 1 capsule (500 mg total) by mouth 4 (four) times daily for 5 days.  Dispense: 20 capsule; Refill: 0  With concern for secondary cellulitis. Will have him continue keeping the area clean and dry, continuing Benadryl OTC and topicals. Keflex  per orders. In-person follow-up precautions reviewed with patient.   Follow Up Instructions: I discussed the assessment and treatment plan with the patient. The patient was provided an opportunity to ask questions and all were answered. The patient agreed with the plan and demonstrated an  understanding of the instructions.  A copy of instructions were sent to the patient via MyChart unless otherwise noted below.   The patient was advised to call back or seek an in-person evaluation if the symptoms worsen or if the condition fails to improve as anticipated.    Marc Velma Lunger, PA-C
